# Patient Record
Sex: Female | Born: 1995
Health system: Southern US, Community
[De-identification: ages and names within clinical notes are randomized; demographics above are authoritative.]

## PROBLEM LIST (undated history)

## (undated) DIAGNOSIS — F17200 Nicotine dependence, unspecified, uncomplicated: Secondary | ICD-10-CM

## (undated) DIAGNOSIS — I8290 Acute embolism and thrombosis of unspecified vein: Secondary | ICD-10-CM

## (undated) DIAGNOSIS — T7840XA Allergy, unspecified, initial encounter: Secondary | ICD-10-CM

## (undated) DIAGNOSIS — D682 Hereditary deficiency of other clotting factors: Secondary | ICD-10-CM

## (undated) DIAGNOSIS — D6851 Activated protein C resistance: Secondary | ICD-10-CM

## (undated) DIAGNOSIS — G43909 Migraine, unspecified, not intractable, without status migrainosus: Secondary | ICD-10-CM

## (undated) DIAGNOSIS — J45909 Unspecified asthma, uncomplicated: Secondary | ICD-10-CM

## (undated) HISTORY — DX: Acute embolism and thrombosis of unspecified vein: I82.90

## (undated) HISTORY — DX: Unspecified asthma, uncomplicated: J45.909

## (undated) HISTORY — DX: Activated protein C resistance: D68.51

## (undated) HISTORY — DX: Allergy, unspecified, initial encounter: T78.40XA

## (undated) HISTORY — DX: Nicotine dependence, unspecified, uncomplicated: F17.200

## (undated) HISTORY — PX: TONSILLECTOMY: SUR1361

---
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
## 2015-01-01 ENCOUNTER — Ambulatory Visit (INDEPENDENT_AMBULATORY_CARE_PROVIDER_SITE_OTHER): Payer: BLUE CROSS/BLUE SHIELD

## 2015-01-01 DIAGNOSIS — Z23 Encounter for immunization: Secondary | ICD-10-CM | POA: Diagnosis not present

## 2015-01-04 ENCOUNTER — Encounter (HOSPITAL_COMMUNITY): Payer: BLUE CROSS/BLUE SHIELD

## 2015-01-04 ENCOUNTER — Ambulatory Visit: Payer: BLUE CROSS/BLUE SHIELD | Admitting: Vascular Surgery

## 2015-01-13 ENCOUNTER — Telehealth: Payer: Self-pay | Admitting: Vascular Surgery

## 2015-01-13 NOTE — Telephone Encounter (Signed)
LM for pt with appt info in response to Liz's stf msg (copied below)   Courtney Bush needs to have a 3 month fu scheduled. She was going to call to schedule this but she hasn't done it yet. It's a fu from stab phleb procedure so could be 6-8 weeks or 3 months. Whatever her sched allows. [phone]

## 2015-04-02 ENCOUNTER — Encounter: Payer: Self-pay | Admitting: Vascular Surgery

## 2015-04-05 ENCOUNTER — Encounter: Payer: Self-pay | Admitting: Vascular Surgery

## 2015-04-13 ENCOUNTER — Ambulatory Visit: Payer: BLUE CROSS/BLUE SHIELD | Admitting: Vascular Surgery

## 2015-07-28 ENCOUNTER — Encounter: Payer: Self-pay | Admitting: Physician Assistant

## 2015-07-28 ENCOUNTER — Ambulatory Visit (INDEPENDENT_AMBULATORY_CARE_PROVIDER_SITE_OTHER): Payer: BLUE CROSS/BLUE SHIELD | Admitting: Physician Assistant

## 2015-07-28 VITALS — BP 127/81 | HR 80 | Temp 97.9°F | Ht 67.02 in | Wt 138.6 lb

## 2015-07-28 DIAGNOSIS — M79672 Pain in left foot: Secondary | ICD-10-CM | POA: Diagnosis not present

## 2015-07-28 NOTE — Progress Notes (Signed)
Subjective:     Patient ID: Courtney Bush, female   DOB: Sep 29, 1995, 20 y.o.   MRN: [id number]  HPI Pt with knot to the L ankle x 1 month She denies any trauma/injury No hx of sprain/strains to the ankle No OTC meds for sx Swelling/sx worse by the end of the day  Review of Systems + pain but no numbness to the foot    Objective:   Physical Exam NAD Gait nl FROM of the ankle No ecchy to the ankle Sl full noted to the anterior lateral part of the ankle Good pulses/sensory Good strength    Assessment:     1. Left foot pain        Plan:     Discussed possibility of gang cyst  Nl course reviewed OTC NSAIDS If sx continue discussed referral to Ortho F/U prn

## 2015-07-28 NOTE — Patient Instructions (Signed)
Ganglion Cyst  A ganglion cyst is a noncancerous, fluid-filled lump that occurs near joints or tendons. The ganglion cyst grows out of a joint or the lining of a tendon. It most often develops in the hand or wrist, but it can also develop in the shoulder, elbow, hip, knee, ankle, or foot. The round or oval ganglion cyst can be the size of a pea or larger than a grape. Increased activity may enlarge the size of the cyst because more fluid starts to build up.   CAUSES  It is not known what causes a ganglion cyst to grow. However, it may be related to:  · Inflammation or irritation around the joint.  · An injury.  · Repetitive movements or overuse.  · Arthritis.  RISK FACTORS  Risk factors include:  · Being a woman.  · Being age 20-50.  SIGNS AND SYMPTOMS  Symptoms may include:   · A lump. This most often appears on the hand or wrist, but it can occur in other areas of the body.  · Tingling.  · Pain.  · Numbness.  · Muscle weakness.  · Weak grip.  · Less movement in a joint.  DIAGNOSIS  Ganglion cysts are most often diagnosed based on a physical exam. Your health care provider will feel the lump and may shine a light alongside it. If it is a ganglion cyst, a light often shines through it. Your health care provider may order an X-ray, ultrasound, or MRI to rule out other conditions.  TREATMENT  Ganglion cysts usually go away on their own without treatment. If pain or other symptoms are involved, treatment may be needed. Treatment is also needed if the ganglion cyst limits your movement or if it gets infected. Treatment may include:  · Wearing a brace or splint on your wrist or finger.  · Taking anti-inflammatory medicine.  · Draining fluid from the lump with a needle (aspiration).  · Injecting a steroid into the joint.  · Surgery to remove the ganglion cyst.  HOME CARE INSTRUCTIONS  · Do not press on the ganglion cyst, poke it with a needle, or hit it.  · Take medicines only as directed by your health care  provider.  · Wear your brace or splint as directed by your health care provider.  · Watch your ganglion cyst for any changes.  · Keep all follow-up visits as directed by your health care provider. This is important.  SEEK MEDICAL CARE IF:  · Your ganglion cyst becomes larger or more painful.  · You have increased redness, red streaks, or swelling.  · You have pus coming from the lump.  · You have weakness or numbness in the affected area.  · You have a fever or chills.     This information is not intended to replace advice given to you by your health care provider. Make sure you discuss any questions you have with your health care provider.     Document Released: 02/25/2000 Document Revised: 03/20/2014 Document Reviewed: 08/12/2013  Elsevier Interactive Patient Education ©2016 [employer]

## 2015-09-06 ENCOUNTER — Ambulatory Visit (INDEPENDENT_AMBULATORY_CARE_PROVIDER_SITE_OTHER): Payer: BLUE CROSS/BLUE SHIELD | Admitting: Vascular Surgery

## 2015-09-06 ENCOUNTER — Encounter: Payer: Self-pay | Admitting: Vascular Surgery

## 2015-09-06 VITALS — BP 133/87 | HR 81 | Temp 98.0°F | Resp 16 | Ht 67.0 in | Wt 135.0 lb

## 2015-09-06 DIAGNOSIS — I83891 Varicose veins of right lower extremities with other complications: Secondary | ICD-10-CM | POA: Diagnosis not present

## 2015-09-06 NOTE — Progress Notes (Signed)
Subjective:     Patient ID: Courtney Bush, female   DOB: 02-Jul-1995, 20 y.o.   MRN: [id number]  HPI this 20 year old female previously was scheduled for laser ablation of the right small saphenous vein plus multiple stab phlebectomy of painful varicosities in October 2016. The time of the procedure she was found to have the small saphenous vein thrombosed and therefore only underwent stab phlebectomy of multiple varicosities. She did not return for follow-up. She returns today complaining of some aching discomfort in the right calf but no specific recurrent varicosities, swelling, stasis ulcers, or bleeding. She does not were elastic compression stockings. She also has noticed a small "lump" on the dorsum of her left foot which she is concerned about. She has no history of DVT or thrombophlebitis.  Past Medical History  Diagnosis Date  . Asthma   . Allergy     factor fived deficiency    Social History  Substance Use Topics  . Smoking status: Never Smoker   . Smokeless tobacco: Never Used  . Alcohol Use: No    Family History  Problem Relation Age of Onset  . Hypertension Father   . Hyperlipidemia Father     Allergies  Allergen Reactions  . Other Hives and Rash    Tree nuts     Current outpatient prescriptions:  .  albuterol (PROVENTIL HFA;VENTOLIN HFA) 108 (90 BASE) MCG/ACT inhaler, Inhale 2 puffs into the lungs every 6 (six) hours as needed for wheezing., Disp: 1 Inhaler, Rfl: 2 .  EPINEPHrine (EPIPEN) 0.3 mg/0.3 mL SOAJ, Inject 0.3 mLs (0.3 mg total) into the muscle once. (Patient not taking: Reported on 07/28/2015), Disp: 1 Device, Rfl: 1 .  rizatriptan (MAXALT) 10 MG tablet, Take 1 tablet (10 mg total) by mouth as needed for migraine. May repeat in 2 hours if needed, Disp: 10 tablet, Rfl: 0  Filed Vitals:   09/06/15 1331  BP: 133/87  Pulse: 81  Temp: 98 F (36.7 C)  Resp: 16  Height: 5\' 7"  (1.702 m)  Weight: 135 lb (61.236 kg)  SpO2: 100%    Body mass index is 21.14  kg/(m^2).           Review of Systems denies chest pain, dyspnea on exertion, PND, orthopnea, hemoptysis     Objective:   Physical Exam BP 133/87 mmHg  Pulse 81  Temp(Src) 98 F (36.7 C)  Resp 16  Ht 5\' 7"  (1.702 m)  Wt 135 lb (61.236 kg)  BMI 21.14 kg/m2  SpO2 100%  Gen. healthy female no apparent distress alert and oriented 3 Lungs no rhonchi or wheezing Cardiovascular regular rhythm no murmurs Right leg with no varicosities noted. No distal edema noted. 3+ dorsalis pedis pulse palpable. Left leg with small 4 mm nodule dorsum left foot which appears to be a small ganglion cyst. Does not appear to be associated with arterial or venous systems. 3+ dorsalis pedis pulse palpable. No varicosities noted left leg. No edema noted.  I performed an independent sono site ultrasound exam on the right leg posteriorly in the right small saphenous vein is very small in caliber with no varicosities noted     Assessment:     Status post multiple stab phlebectomy painful varicosities right leg performed in October 2016-doing well Probable small ganglion cyst dorsum left foot    Plan:     If nodule on dorsum of left foot becomes tender or enlarges she should be evaluated by orthopedic surgeon Return to see me on when  necessary basis-no indication for any venous procedure

## 2015-09-23 DIAGNOSIS — M67472 Ganglion, left ankle and foot: Secondary | ICD-10-CM | POA: Diagnosis not present

## 2015-09-23 DIAGNOSIS — M779 Enthesopathy, unspecified: Secondary | ICD-10-CM | POA: Diagnosis not present

## 2015-10-15 ENCOUNTER — Other Ambulatory Visit: Payer: Self-pay | Admitting: Nurse Practitioner

## 2015-10-15 ENCOUNTER — Telehealth: Payer: Self-pay | Admitting: *Deleted

## 2015-10-15 ENCOUNTER — Ambulatory Visit (INDEPENDENT_AMBULATORY_CARE_PROVIDER_SITE_OTHER): Payer: BLUE CROSS/BLUE SHIELD | Admitting: Nurse Practitioner

## 2015-10-15 ENCOUNTER — Ambulatory Visit (HOSPITAL_COMMUNITY)
Admission: RE | Admit: 2015-10-15 | Discharge: 2015-10-15 | Disposition: A | Discharge status: Home / Self Care | Payer: BLUE CROSS/BLUE SHIELD | Source: Ambulatory Visit | Attending: Nurse Practitioner | Admitting: Nurse Practitioner

## 2015-10-15 ENCOUNTER — Encounter: Payer: Self-pay | Admitting: Nurse Practitioner

## 2015-10-15 VITALS — BP 113/72 | HR 73 | Temp 97.7°F

## 2015-10-15 DIAGNOSIS — I83811 Varicose veins of right lower extremities with pain: Secondary | ICD-10-CM

## 2015-10-15 DIAGNOSIS — M79604 Pain in right leg: Secondary | ICD-10-CM

## 2015-10-15 DIAGNOSIS — M79661 Pain in right lower leg: Secondary | ICD-10-CM | POA: Diagnosis not present

## 2015-10-15 NOTE — Telephone Encounter (Signed)
Spoke with Nicholaus Bloom Ladona Ridgel White's mother).  Nicholaus Bloom states that her daughter has been experiencing  pain in posterior right calf for 3 days.  Nicholaus Bloom says the pain is radiating up towards her daughter's back.  Nicholaus Bloom states her daughter denies right foot or ankle swelling and denies trauma to right leg in recent past.  Kandas Kravets is s/p stab phlebectomy right leg 12-2014 and was last seen by Dr. Hart Rochester on 09-06-2015.  Reviewed Dr. Candie Chroman office note with Mrs. White.  On ultrasound (Sonosite) exam by Dr. Hart Rochester on 09-06-2015, Dr. Hart Rochester said the right small saphenous vein was of small caliber and that no venous treatment/procedures were recommended.  Dr. Hart Rochester suggested that Jerline Pain be evaluated by orthopedic surgeon.  Advised Mrs. White to call [facility] if she had further questions or concerns.

## 2015-10-15 NOTE — Progress Notes (Signed)
   Subjective:    Patient ID: Courtney Bush, female    DOB: [birth year], 20 y.o.   MRN: [id number]  HPI Patient comes in today with her mom c/o right leg pain - mainly in calf - pain starts in right calf and travels up the back of her leg and into butt cheek. Painful to walk on- elevating leg and laying still makes pain better. Has tried ibuprofen as well which has helped some. SHe has had a veins removed from that leg last year.    Review of Systems  Constitutional: Negative.   HENT: Negative.   Respiratory: Negative.   Cardiovascular: Negative.   Genitourinary: Negative.   Neurological: Negative.   Psychiatric/Behavioral: Negative.   All other systems reviewed and are negative.      Objective:   Physical Exam  Constitutional: She is oriented to person, place, and time. She appears well-developed and well-nourished.  Cardiovascular: Normal rate, regular rhythm and normal heart sounds.   Pulmonary/Chest: Effort normal.  Musculoskeletal:  Varicose vein right posterior knee that is painful on palpation.  Neurological: She is alert and oriented to person, place, and time.  Skin: Skin is warm.  Psychiatric: She has a normal mood and affect. Her behavior is normal. Judgment and thought content normal.    BP 113/72   Pulse 73   Temp 97.7 F (36.5 C) (Oral)       Assessment & Plan:   1. Right leg pain   2. Varicose veins of leg with pain, right    U/s ordered Will call with results RTO prn  Mary-Margaret Daphine Deutscher, FNP

## 2015-10-16 ENCOUNTER — Encounter: Payer: Self-pay | Admitting: Nurse Practitioner

## 2015-10-16 ENCOUNTER — Telehealth: Payer: Self-pay | Admitting: Pediatrics

## 2015-10-16 NOTE — Telephone Encounter (Signed)
Called and done by [facility]

## 2016-01-04 ENCOUNTER — Ambulatory Visit (INDEPENDENT_AMBULATORY_CARE_PROVIDER_SITE_OTHER): Payer: BLUE CROSS/BLUE SHIELD

## 2016-01-04 DIAGNOSIS — Z23 Encounter for immunization: Secondary | ICD-10-CM

## 2016-01-07 ENCOUNTER — Encounter: Payer: Self-pay | Admitting: Physician Assistant

## 2016-01-07 ENCOUNTER — Ambulatory Visit (INDEPENDENT_AMBULATORY_CARE_PROVIDER_SITE_OTHER): Payer: BLUE CROSS/BLUE SHIELD | Admitting: Physician Assistant

## 2016-01-07 VITALS — BP 113/77 | HR 94 | Temp 97.2°F | Ht 67.0 in | Wt 130.0 lb

## 2016-01-07 DIAGNOSIS — T63301A Toxic effect of unspecified spider venom, accidental (unintentional), initial encounter: Secondary | ICD-10-CM | POA: Diagnosis not present

## 2016-01-07 MED ORDER — DIPHENHYDRAMINE HCL 25 MG PO CAPS
50.0000 mg | ORAL_CAPSULE | Freq: Once | ORAL | Status: AC
Start: 2016-01-07 — End: 2016-01-07
  Administered 2016-01-07: 50 mg via ORAL

## 2016-01-07 MED ORDER — CEPHALEXIN 500 MG PO CAPS: 500.0000 mg | ORAL_CAPSULE | Freq: Three times a day (TID) | ORAL | 0 refills | Status: DC

## 2016-01-07 MED ORDER — METHYLPREDNISOLONE ACETATE 80 MG/ML IJ SUSP
80.0000 mg | Freq: Once | INTRAMUSCULAR | Status: AC
  Administered 2016-01-07: 80 mg via INTRAMUSCULAR

## 2016-01-07 NOTE — Patient Instructions (Signed)
Black Widow Spider Bite °The adult female black widow spider has a black body about ½ inch (1.2 cm) long with an orange-red hourglass shape on her belly. Black widow spider bites can be serious and life-threatening. °SYMPTOMS  °Symptoms usually develop in 15 minutes to 2 hours after the bite. Symptoms usually peak in 3 hours to 1 day and may include: °· Muscle cramps. These may occur anywhere in the body, including the abdomen. °· Nausea and vomiting. °· Dizziness. °· Heavy sweating. °· Shaking. °· Restlessness. °· Fever. °· Trouble breathing. °· Chest pain. °· Swelling or a rash around the bite. °TREATMENT  °Serious reactions may require hospitalization. Your caregiver may give you an antivenom injection to help reduce pain. This is only done for very severe reactions that are not effectively treated with other medicines. There is a chance of having an allergic reaction to the antivenom since it is derived from horse serum. °HOME CARE INSTRUCTIONS  °· Do not scratch the bite area. Keep the area clean and covered with an adhesive bandage or sterile gauze bandage. °· Wash the area 3 times per day in warm, soapy water or as directed by your caregiver. °· Put ice or cool compresses on the bite area. °¨ Put ice in a plastic bag. °¨ Place a towel between your skin and the bag. °¨ Leave the ice on for 20 minutes, 4 times a day for the first 2 days or as directed. °· Keep the bite area elevated above the level of your heart. This helps reduce redness and swelling. °· Only take over-the-counter or prescription medicines for pain, discomfort, or fever as directed by your caregiver. °· Watch the bite area closely for worsening pain, swelling, redness, or pus. °You may need a tetanus shot if: °· You cannot remember when you had your last tetanus shot. °· You have never had a tetanus shot. °· The injury broke your skin. °If you get a tetanus shot, your arm may swell, get red, and feel warm to the touch. This is common and not a  problem. If you need a tetanus shot and you choose not to have one, there is a rare chance of getting tetanus. Sickness from tetanus can be serious. °SEEK IMMEDIATE MEDICAL CARE IF:  °· You have muscle cramps or abdominal pain or cramps. °· You develop rapid breathing or a rapid heartbeat. °· You become extremely restless or confused. °· You have chest pain. °· You feel faint, lightheaded, or you feel generally ill (malaise). °· You develop pain, soreness, redness, swelling, or drainage from the bite. °· You have a fever. °MAKE SURE YOU:  °· Understand these instructions. °· Will watch your condition. °· Will get help right away if you are not doing well or get worse. °  °This information is not intended to replace advice given to you by your health care provider. Make sure you discuss any questions you have with your health care provider. °  °Document Released: 02/27/2005 Document Revised: 05/22/2011 Document Reviewed: 07/15/2014 °Elsevier Interactive Patient Education ©2016 Elsevier Inc. ° °

## 2016-01-07 NOTE — Progress Notes (Signed)
BP 113/77 (BP Location: Left Arm, Patient Position: Sitting, Cuff Size: Normal)   Pulse 94   Temp 97.2 F (36.2 C) (Oral)   Ht 5\' 7"  (1.702 m)   Wt 130 lb (59 kg)   SpO2 100%   BMI 20.36 kg/m    Subjective:    Patient ID: Courtney Bush, female    DOB: 1995-11-12, 20 y.o.   MRN: [id number]  HPI: Courtney Bush is a 20 y.o. female presenting on 01/07/2016 for Insect Bite Was bitten just a few hours ago. Was a black d=spider that cam out of her bath towel. Bit her on the right upper arm. Now with pain and swelling at the site, some throat fullness and awareness when she swallows. Denies SOB, cough or wheeze.  No NVD.  Spider was not a black widow.   Relevant past medical, surgical, family and social history reviewed and updated as indicated. Allergies and medications reviewed and updated.  Past Medical History:  Diagnosis Date  . Allergy    factor fived deficiency  . Asthma     Past Surgical History:  Procedure Laterality Date  . TONSILLECTOMY      Review of Systems  Constitutional: Negative.  Negative for activity change, fatigue and fever.  HENT: Negative.   Eyes: Negative.   Respiratory: Positive for apnea, chest tightness and shortness of breath. Negative for cough, choking, wheezing and stridor.   Cardiovascular: Negative.  Negative for chest pain.  Gastrointestinal: Negative.  Negative for abdominal pain.  Endocrine: Negative.   Genitourinary: Negative.  Negative for dysuria.  Musculoskeletal: Negative.   Skin: Negative.   Neurological: Negative.       Medication List       Accurate as of 01/07/16 12:12 PM. Always use your most recent med list.          albuterol 108 (90 Base) MCG/ACT inhaler Commonly known as:  PROVENTIL HFA;VENTOLIN HFA Inhale 2 puffs into the lungs every 6 (six) hours as needed for wheezing.   cephALEXin 500 MG capsule Commonly known as:  KEFLEX Take 1 capsule (500 mg total) by mouth 3 (three) times daily.   EPINEPHrine 0.3  mg/0.3 mL Soaj injection Commonly known as:  EPIPEN Inject 0.3 mLs (0.3 mg total) into the muscle once.   rizatriptan 10 MG tablet Commonly known as:  MAXALT Take 1 tablet (10 mg total) by mouth as needed for migraine. May repeat in 2 hours if needed          Objective:    BP 113/77 (BP Location: Left Arm, Patient Position: Sitting, Cuff Size: Normal)   Pulse 94   Temp 97.2 F (36.2 C) (Oral)   Ht 5\' 7"  (1.702 m)   Wt 130 lb (59 kg)   SpO2 100%   BMI 20.36 kg/m   Allergies  Allergen Reactions  . Other Hives and Rash    Tree nuts    Physical Exam  Constitutional: She is oriented to person, place, and time. She appears well-developed and well-nourished.  HENT:  Head: Normocephalic and atraumatic.  Eyes: Conjunctivae and EOM are normal. Pupils are equal, round, and reactive to light.  Neck: Normal range of motion. Neck supple.  Cardiovascular: Normal rate, regular rhythm, normal heart sounds and intact distal pulses.   Pulmonary/Chest: Effort normal and breath sounds normal.  Abdominal: Soft. Bowel sounds are normal.  Neurological: She is alert and oriented to person, place, and time. She has normal reflexes.  Skin: Skin is warm and  dry. Lesion and rash noted. Rash is urticarial. No cyanosis. No pallor.     Psychiatric: She has a normal mood and affect. Her behavior is normal. Judgment and thought content normal.  Nursing note and vitals reviewed.       Assessment & Plan:   1. Allergic reaction to spider bite, accidental or unintentional, initial encounter - cephALEXin (KEFLEX) 500 MG capsule; Take 1 capsule (500 mg total) by mouth 3 (three) times daily.  Dispense: 21 capsule; Refill: 0 Benadryl 50 mg orally in office Depomedrol 80 mg IM in office Out of work today.  Continue all other maintenance medications as listed above.  Follow up plan: Return if symptoms worsen or fail to improve.  Educational handout given for spider bite  Remus LofflerAngel S. Armanii Pressnell  PA-C [facility] [address]  [place], [place] [id number] [phone]   01/07/2016, 12:12 PM

## 2016-05-20 ENCOUNTER — Ambulatory Visit (INDEPENDENT_AMBULATORY_CARE_PROVIDER_SITE_OTHER): Payer: BLUE CROSS/BLUE SHIELD | Admitting: Physician Assistant

## 2016-05-20 VITALS — BP 119/68 | HR 88 | Temp 98.1°F | Ht 67.0 in | Wt 127.0 lb

## 2016-05-20 DIAGNOSIS — G43909 Migraine, unspecified, not intractable, without status migrainosus: Secondary | ICD-10-CM | POA: Diagnosis not present

## 2016-05-20 DIAGNOSIS — M545 Low back pain, unspecified: Secondary | ICD-10-CM

## 2016-05-20 DIAGNOSIS — M546 Pain in thoracic spine: Secondary | ICD-10-CM | POA: Diagnosis not present

## 2016-05-20 DIAGNOSIS — R200 Anesthesia of skin: Secondary | ICD-10-CM | POA: Diagnosis not present

## 2016-05-20 DIAGNOSIS — R599 Enlarged lymph nodes, unspecified: Secondary | ICD-10-CM | POA: Diagnosis not present

## 2016-05-20 DIAGNOSIS — R51 Headache: Secondary | ICD-10-CM | POA: Diagnosis not present

## 2016-05-20 DIAGNOSIS — R519 Headache, unspecified: Secondary | ICD-10-CM

## 2016-05-20 DIAGNOSIS — M542 Cervicalgia: Secondary | ICD-10-CM

## 2016-05-20 MED ORDER — PREDNISONE 10 MG (21) PO TBPK: ORAL_TABLET | ORAL | 0 refills | Status: DC

## 2016-05-20 MED ORDER — RIZATRIPTAN BENZOATE 10 MG PO TABS: 10.0000 mg | ORAL_TABLET | ORAL | 0 refills | Status: DC | PRN

## 2016-05-20 NOTE — Patient Instructions (Signed)
Back Pain, Adult  Back pain is very common. The pain often gets better over time. The cause of back pain is usually not dangerous. Most people can learn to manage their back pain on their own.  Follow these instructions at home:  Watch your back pain for any changes. The following actions may help to lessen any pain you are feeling:  · Stay active. Start with short walks on flat ground if you can. Try to walk farther each day.  · Exercise regularly as told by your doctor. Exercise helps your back heal faster. It also helps avoid future injury by keeping your muscles strong and flexible.  · Do not sit, drive, or stand in one place for more than 30 minutes.  · Do not stay in bed. Resting more than 1-2 days can slow down your recovery.  · Be careful when you bend or lift an object. Use good form when lifting:  ? Bend at your knees.  ? Keep the object close to your body.  ? Do not twist.  · Sleep on a firm mattress. Lie on your side, and bend your knees. If you lie on your back, put a pillow under your knees.  · Take medicines only as told by your doctor.  · Put ice on the injured area.  ? Put ice in a plastic bag.  ? Place a towel between your skin and the bag.  ? Leave the ice on for 20 minutes, 2-3 times a day for the first 2-3 days. After that, you can switch between ice and heat packs.  · Avoid feeling anxious or stressed. Find good ways to deal with stress, such as exercise.  · Maintain a healthy weight. Extra weight puts stress on your back.    Contact a doctor if:  · You have pain that does not go away with rest or medicine.  · You have worsening pain that goes down into your legs or buttocks.  · You have pain that does not get better in one week.  · You have pain at night.  · You lose weight.  · You have a fever or chills.  Get help right away if:  · You cannot control when you poop (bowel movement) or pee (urinate).  · Your arms or legs feel weak.  · Your arms or legs lose feeling (numbness).  · You feel sick  to your stomach (nauseous) or throw up (vomit).  · You have belly (abdominal) pain.  · You feel like you may pass out (faint).  This information is not intended to replace advice given to you by your health care provider. Make sure you discuss any questions you have with your health care provider.  Document Released: 08/16/2007 Document Revised: 08/05/2015 Document Reviewed: 07/01/2013  Elsevier Interactive Patient Education © 2017 Elsevier Inc.

## 2016-05-22 LAB — URINALYSIS
Bilirubin, UA: NEGATIVE
Glucose, UA: NEGATIVE
Ketones, UA: NEGATIVE
Nitrite, UA: NEGATIVE
Protein, UA: NEGATIVE
RBC, UA: NEGATIVE
Specific Gravity, UA: 1.02 (ref 1.005–1.030)
Urobilinogen, Ur: 0.2 mg/dL (ref 0.2–1.0)
pH, UA: 5.5 (ref 5.0–7.5)

## 2016-05-22 NOTE — Progress Notes (Signed)
BP 119/68   Pulse 88   Temp 98.1 F (36.7 C) (Oral)   Ht 5' 7"  (1.702 m)   Wt 127 lb (57.6 kg)   BMI 19.89 kg/m    Subjective:    Patient ID: Courtney Bush, female    DOB: 25-Feb-1996, 21 y.o.   MRN: [id number]  HPI: Courtney Bush is a 21 y.o. female presenting on 05/20/2016 for Neck Pain; Back Pain; and enlarged lymph node in neck  Over 5 days increasing headache and associated midline back back from the cervical to the lumbar area. Has episodic associated bilateral hand numbness. 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 Does not remember any head injury distant , no ticks have been seen. Family history is negative for neurologic disorders. Relevant past medical, surgical, family and social history reviewed and updated as indicated. Allergies and medications reviewed and updated.  Past Medical History:  Diagnosis Date  . Allergy    factor fived deficiency  . Asthma     Past Surgical History:  Procedure Laterality Date  . TONSILLECTOMY      Review of Systems  Constitutional: Negative for activity change, fatigue and fever.  HENT: Negative.   Eyes: Negative.   Respiratory: Negative.  Negative for cough.   Cardiovascular: Negative.  Negative for chest pain.  Gastrointestinal: Negative.  Negative for abdominal pain.  Endocrine: Negative.   Genitourinary: Negative.  Negative for dysuria.  Musculoskeletal: Positive for arthralgias, back pain, myalgias, neck pain and neck stiffness.  Skin: Negative.   Neurological: Positive for weakness, numbness and headaches. Negative for dizziness, tremors, seizures, syncope, facial asymmetry and light-headedness.    Allergies as of 05/20/2016      Reactions   Other Hives, Rash   Tree nuts      Medication List       Accurate as of 05/20/16 11:59 PM. Always use your most recent med list.          albuterol 108 (90 Base) MCG/ACT inhaler Commonly known as:  PROVENTIL HFA;VENTOLIN HFA Inhale 2 puffs into  the lungs every 6 (six) hours as needed for wheezing.   EPINEPHrine 0.3 mg/0.3 mL Soaj injection Commonly known as:  EPIPEN Inject 0.3 mLs (0.3 mg total) into the muscle once.   predniSONE 10 MG (21) Tbpk tablet Commonly known as:  STERAPRED UNI-PAK 21 TAB As directed x 6 days   rizatriptan 10 MG tablet Commonly known as:  MAXALT Take 1 tablet (10 mg total) by mouth as needed for migraine. May repeat in 2 hours if needed          Objective:    BP 119/68   Pulse 88   Temp 98.1 F (36.7 C) (Oral)   Ht 5' 7"  (1.702 m)   Wt 127 lb (57.6 kg)   BMI 19.89 kg/m   Allergies  Allergen Reactions  . Other Hives and Rash    Tree nuts    Physical Exam  Constitutional: She is oriented to person, place, and time. She appears well-developed and well-nourished.  HENT:  Head: Normocephalic and atraumatic.  Right Ear: Tympanic membrane, external ear and ear canal normal.  Left Ear: Tympanic membrane, external ear and ear canal normal.  Nose: Nose normal. No rhinorrhea.  Mouth/Throat: Oropharynx is clear and moist and mucous membranes are normal. No oropharyngeal exudate or posterior oropharyngeal erythema.  Eyes: Conjunctivae and EOM are normal. Pupils are equal, round, and reactive to light.  Neck: Normal range of  motion. Neck supple.  Cardiovascular: Normal rate, regular rhythm, normal heart sounds and intact distal pulses.   Pulmonary/Chest: Effort normal and breath sounds normal.  Abdominal: Soft. Bowel sounds are normal.  Musculoskeletal:       Cervical back: She exhibits tenderness and spasm. She exhibits normal range of motion.       Thoracic back: She exhibits tenderness and pain. She exhibits normal range of motion and no deformity.       Lumbar back: She exhibits tenderness and pain. She exhibits normal range of motion and no deformity.  Neurological: She is alert and oriented to person, place, and time. She has normal reflexes.  Skin: Skin is warm and dry. No rash noted.    Psychiatric: She has a normal mood and affect. Her behavior is normal. Judgment and thought content normal.  Nursing note and vitals reviewed.   Results for orders placed or performed in visit on 05/20/16  Urinalysis  Result Value Ref Range   Specific Gravity, UA 1.020 1.005 - 1.030   pH, UA 5.5 5.0 - 7.5   Color, UA Yellow Yellow   Appearance Ur Clear Clear   Leukocytes, UA Trace (A) Negative   Protein, UA Negative Negative/Trace   Glucose, UA Negative Negative   Ketones, UA Negative Negative   RBC, UA Negative Negative   Bilirubin, UA Negative Negative   Urobilinogen, Ur 0.2 0.2 - 1.0 mg/dL   Nitrite, UA Negative Negative  CBC with Differential  Result Value Ref Range   WBC 7.0 3.4 - 10.8 x10E3/uL   RBC 4.47 3.77 - 5.28 x10E6/uL   Hemoglobin 14.3 11.1 - 15.9 g/dL   Hematocrit 42.5 34.0 - 46.6 %   MCV 95 79 - 97 fL   MCH 32.0 26.6 - 33.0 pg   MCHC 33.6 31.5 - 35.7 g/dL   RDW 12.9 12.3 - 15.4 %   Platelets 289 150 - 379 x10E3/uL   Neutrophils 46 Not Estab. %   Lymphs 39 Not Estab. %   Monocytes 11 Not Estab. %   Eos 4 Not Estab. %   Basos 0 Not Estab. %   Neutrophils Absolute 3.2 1.4 - 7.0 x10E3/uL   Lymphocytes Absolute 2.7 0.7 - 3.1 x10E3/uL   Monocytes Absolute 0.8 0.1 - 0.9 x10E3/uL   EOS (ABSOLUTE) 0.3 0.0 - 0.4 x10E3/uL   Basophils Absolute 0.0 0.0 - 0.2 x10E3/uL   Immature Granulocytes 0 Not Estab. %   Immature Grans (Abs) 0.0 0.0 - 0.1 x10E3/uL  CMP14+EGFR  Result Value Ref Range   Glucose 70 65 - 99 mg/dL   BUN 7 6 - 20 mg/dL   Creatinine, Ser 0.87 0.57 - 1.00 mg/dL   GFR calc non Af Amer 96 >59 mL/min/1.73   GFR calc Af Amer 111 >59 mL/min/1.73   BUN/Creatinine Ratio 8 (L) 9 - 23   Sodium 144 134 - 144 mmol/L   Potassium 4.6 3.5 - 5.2 mmol/L   Chloride 105 96 - 106 mmol/L   CO2 25 18 - 29 mmol/L   Calcium 9.5 8.7 - 10.2 mg/dL   Total Protein 6.8 6.0 - 8.5 g/dL   Albumin 4.4 3.5 - 5.5 g/dL   Globulin, Total 2.4 1.5 - 4.5 g/dL   Albumin/Globulin  Ratio 1.8 1.2 - 2.2   Bilirubin Total 0.4 0.0 - 1.2 mg/dL   Alkaline Phosphatase 42 39 - 117 IU/L   AST 16 0 - 40 IU/L   ALT 12 0 - 32 IU/L  Lyme Ab/Western  Blot Reflex  Result Value Ref Range   Lyme IgG/IgM Ab WILL FOLLOW    LYME DISEASE AB, QUANT, IGM WILL FOLLOW   Rocky mtn spotted fvr abs pnl(IgG+IgM)  Result Value Ref Range   RMSF IgG WILL FOLLOW    RMSF IgM WILL FOLLOW       Assessment & Plan:   1. Acute midline low back pain without sciatica - Urinalysis  2. Neck pain - Ambulatory referral to Neurology  3. Acute midline thoracic back pain - CBC with Differential - CMP14+EGFR - Ambulatory referral to Neurology  4. Enlarged lymph node - Lyme Ab/Western Blot Reflex - Rocky mtn spotted fvr abs pnl(IgG+IgM)  5. Acute nonintractable headache, unspecified headache type - CBC with Differential - CMP14+EGFR - Ambulatory referral to Neurology - Lyme Ab/Western Blot Reflex - Rocky mtn spotted fvr abs pnl(IgG+IgM)  6. Hand numbness - Ambulatory referral to Neurology - Lyme Ab/Western Blot Reflex - Rocky mtn spotted fvr abs pnl(IgG+IgM)  7. Migraine without status migrainosus, not intractable, unspecified migraine type - rizatriptan (MAXALT) 10 MG tablet; Take 1 tablet (10 mg total) by mouth as needed for migraine. May repeat in 2 hours if needed  Dispense: 10 tablet; Refill: 0   Continue all other maintenance medications as listed above.  Follow up plan: Return if symptoms worsen or fail to improve.  Educational handout given for headache  Terald Sleeper PA-C [facility] [address]  [place], [place] [address] [phone]   05/22/2016, 10:56 AM

## 2016-05-24 LAB — LYME, WESTERN BLOT, SERUM (REFLEXED)
IgG P18 Ab.: ABSENT
IgG P23 Ab.: ABSENT
IgG P30 Ab.: ABSENT
IgG P39 Ab.: ABSENT
IgG P45 Ab.: ABSENT
IgG P58 Ab.: ABSENT
IgG P93 Ab.: ABSENT
IgM P23 Ab.: ABSENT
IgM P39 Ab.: ABSENT
IgM P41 Ab.: ABSENT
Lyme IgG Wb: NEGATIVE
Lyme IgM Wb: NEGATIVE

## 2016-05-24 LAB — ROCKY MTN SPOTTED FVR ABS PNL(IGG+IGM)
RMSF IgG: NEGATIVE
RMSF IgM: 0.68 index (ref 0.00–0.89)

## 2016-05-24 LAB — CBC WITH DIFFERENTIAL/PLATELET
Basophils Absolute: 0 10*3/uL (ref 0.0–0.2)
Basos: 0 %
EOS (ABSOLUTE): 0.3 10*3/uL (ref 0.0–0.4)
Eos: 4 %
Hematocrit: 42.5 % (ref 34.0–46.6)
Hemoglobin: 14.3 g/dL (ref 11.1–15.9)
Immature Grans (Abs): 0 10*3/uL (ref 0.0–0.1)
Immature Granulocytes: 0 %
Lymphocytes Absolute: 2.7 10*3/uL (ref 0.7–3.1)
Lymphs: 39 %
MCH: 32 pg (ref 26.6–33.0)
MCHC: 33.6 g/dL (ref 31.5–35.7)
MCV: 95 fL (ref 79–97)
Monocytes Absolute: 0.8 10*3/uL (ref 0.1–0.9)
Monocytes: 11 %
Neutrophils Absolute: 3.2 10*3/uL (ref 1.4–7.0)
Neutrophils: 46 %
Platelets: 289 10*3/uL (ref 150–379)
RBC: 4.47 x10E6/uL (ref 3.77–5.28)
RDW: 12.9 % (ref 12.3–15.4)
WBC: 7 10*3/uL (ref 3.4–10.8)

## 2016-05-24 LAB — CMP14+EGFR
ALT: 12 IU/L (ref 0–32)
AST: 16 IU/L (ref 0–40)
Albumin/Globulin Ratio: 1.8 (ref 1.2–2.2)
Albumin: 4.4 g/dL (ref 3.5–5.5)
Alkaline Phosphatase: 42 IU/L (ref 39–117)
BUN/Creatinine Ratio: 8 — ABNORMAL LOW (ref 9–23)
BUN: 7 mg/dL (ref 6–20)
Bilirubin Total: 0.4 mg/dL (ref 0.0–1.2)
CO2: 25 mmol/L (ref 18–29)
Calcium: 9.5 mg/dL (ref 8.7–10.2)
Chloride: 105 mmol/L (ref 96–106)
Creatinine, Ser: 0.87 mg/dL (ref 0.57–1.00)
GFR calc Af Amer: 111 mL/min/{1.73_m2} (ref >59)
GFR calc non Af Amer: 96 mL/min/{1.73_m2} (ref >59)
Globulin, Total: 2.4 g/dL (ref 1.5–4.5)
Glucose: 70 mg/dL (ref 65–99)
Potassium: 4.6 mmol/L (ref 3.5–5.2)
Sodium: 144 mmol/L (ref 134–144)
Total Protein: 6.8 g/dL (ref 6.0–8.5)

## 2016-05-24 LAB — LYME AB/WESTERN BLOT REFLEX
LYME DISEASE AB, QUANT, IGM: <0.8 index (ref 0.00–0.79)
Lyme IgG/IgM Ab: 0.97 {ISR} — ABNORMAL HIGH (ref 0.00–0.90)

## 2016-05-26 ENCOUNTER — Other Ambulatory Visit: Payer: Self-pay | Admitting: *Deleted

## 2016-05-26 MED ORDER — DOXYCYCLINE HYCLATE 100 MG PO TABS: 100.0000 mg | ORAL_TABLET | Freq: Two times a day (BID) | ORAL | 0 refills | Status: DC

## 2016-05-26 NOTE — Progress Notes (Signed)
doxy 

## 2016-05-30 ENCOUNTER — Ambulatory Visit (INDEPENDENT_AMBULATORY_CARE_PROVIDER_SITE_OTHER): Payer: BLUE CROSS/BLUE SHIELD | Admitting: Family Medicine

## 2016-05-30 ENCOUNTER — Ambulatory Visit (INDEPENDENT_AMBULATORY_CARE_PROVIDER_SITE_OTHER): Payer: BLUE CROSS/BLUE SHIELD

## 2016-05-30 ENCOUNTER — Encounter: Payer: Self-pay | Admitting: Family Medicine

## 2016-05-30 VITALS — BP 103/70 | HR 84 | Temp 97.0°F | Ht 67.0 in | Wt 127.6 lb

## 2016-05-30 DIAGNOSIS — M545 Low back pain, unspecified: Secondary | ICD-10-CM

## 2016-05-30 NOTE — Progress Notes (Signed)
   Subjective:    Patient ID: Courtney Leeaylor N Bush, female    DOB: 09-Sep-1995, 21 y.o.   MRN: [id number]  HPI patient has neck and back pain. Symptoms are vague. There is no radiculopathy. Pain is improved with sitting and bending but aggravated by standing. She was treated initially with prednisone and felt better but wants prednisone. Symptoms recurred. She also tested positive for Lyme disease and is currently taking doxycycline.  Patient Active Problem List   Diagnosis Date Noted  . Varicose veins of right lower extremity with complications 09/06/2015  . Varicose veins of right lower extremity 12/28/2014  . Migraine 12/03/2014  . Tree nut allergy 12/03/2014  . Allergic rhinitis 12/03/2014  . Varicose veins of leg with complications 10/27/2014   Outpatient Encounter Prescriptions as of 05/30/2016  Medication Sig  . albuterol (PROVENTIL HFA;VENTOLIN HFA) 108 (90 BASE) MCG/ACT inhaler Inhale 2 puffs into the lungs every 6 (six) hours as needed for wheezing.  Marland Kitchen. doxycycline (VIBRA-TABS) 100 MG tablet Take 1 tablet (100 mg total) by mouth 2 (two) times daily.  Marland Kitchen. EPINEPHrine (EPIPEN) 0.3 mg/0.3 mL SOAJ Inject 0.3 mLs (0.3 mg total) into the muscle once.  . rizatriptan (MAXALT) 10 MG tablet Take 1 tablet (10 mg total) by mouth as needed for migraine. May repeat in 2 hours if needed  . [DISCONTINUED] predniSONE (STERAPRED UNI-PAK 21 TAB) 10 MG (21) TBPK tablet As directed x 6 days   No facility-administered encounter medications on file as of 05/30/2016.       Review of Systems  Musculoskeletal: Positive for back pain and neck pain.       Objective:   Physical Exam  Constitutional: She is oriented to person, place, and time. She appears well-developed and well-nourished.  Musculoskeletal: Normal range of motion. She exhibits no tenderness.  Neurological: She is alert and oriented to person, place, and time. She displays normal reflexes. No cranial nerve deficit. She exhibits normal muscle tone.  Coordination normal.    Temp 97 F (36.1 C) (Oral)   Ht 5\' 7"  (1.702 m)   Wt 127 lb 9.6 oz (57.9 kg)   LMP 05/29/2016   BMI 19.98 kg/m        Assessment & Plan:  1. Acute midline low back pain without sciatica C-spine is some flattening of the disc space. Encouraged to keep appointment with neurologist. Encouraged to continue continue with full course of doxycycline for positive Lyme titer - DG Cervical Spine Complete; Future - DG Lumbar Spine 2-3 Views; Future  Frederica KusterStephen M Miller MD

## 2016-05-30 NOTE — Progress Notes (Signed)
   Subjective:    Patient ID: Courtney Bush, female    DOB: 1995/08/23, 21 y.o.   MRN: [id number]  HPI    Review of Systems     Objective:   Physical Exam        Assessment & Plan:

## 2016-06-13 ENCOUNTER — Ambulatory Visit: Payer: BLUE CROSS/BLUE SHIELD | Admitting: Neurology

## 2016-06-13 ENCOUNTER — Telehealth: Payer: Self-pay | Admitting: Neurology

## 2016-06-13 DIAGNOSIS — M545 Low back pain: Secondary | ICD-10-CM | POA: Diagnosis not present

## 2016-06-13 DIAGNOSIS — M41124 Adolescent idiopathic scoliosis, thoracic region: Secondary | ICD-10-CM | POA: Diagnosis not present

## 2016-06-13 DIAGNOSIS — M546 Pain in thoracic spine: Secondary | ICD-10-CM | POA: Diagnosis not present

## 2016-06-13 NOTE — Telephone Encounter (Signed)
This patient canceled same day of appointment. 

## 2016-06-14 ENCOUNTER — Encounter: Payer: Self-pay | Admitting: Neurology

## 2016-07-19 ENCOUNTER — Encounter: Payer: Self-pay | Admitting: Pediatrics

## 2016-07-19 ENCOUNTER — Ambulatory Visit (INDEPENDENT_AMBULATORY_CARE_PROVIDER_SITE_OTHER): Payer: BLUE CROSS/BLUE SHIELD | Admitting: Pediatrics

## 2016-07-19 VITALS — BP 100/69 | HR 85 | Temp 97.7°F | Ht 67.0 in | Wt 131.4 lb

## 2016-07-19 DIAGNOSIS — J069 Acute upper respiratory infection, unspecified: Secondary | ICD-10-CM

## 2016-07-19 DIAGNOSIS — J029 Acute pharyngitis, unspecified: Secondary | ICD-10-CM

## 2016-07-19 DIAGNOSIS — H9201 Otalgia, right ear: Secondary | ICD-10-CM | POA: Diagnosis not present

## 2016-07-19 LAB — RAPID STREP SCREEN (MED CTR MEBANE ONLY): Strep Gp A Ag, IA W/Reflex: NEGATIVE

## 2016-07-19 LAB — CULTURE, GROUP A STREP

## 2016-07-19 MED ORDER — FLUTICASONE PROPIONATE 50 MCG/ACT NA SUSP: 2.0000 | Freq: Every day | NASAL | 6 refills | Status: DC

## 2016-07-19 MED ORDER — AZITHROMYCIN 250 MG PO TABS: ORAL_TABLET | ORAL | 0 refills | Status: DC

## 2016-07-19 NOTE — Progress Notes (Signed)
  Subjective:   Patient ID: Courtney Bush, female    DOB: 02-11-1996, 21 y.o.   MRN: [id number] CC: Adenopathy; Sore Throat; and Ear Pain (Right)  HPI: Courtney Bush is a 21 y.o. female presenting for Adenopathy; Sore Throat; and Ear Pain (Right)  Ear pain started two days ago Has noticed enlarged LN for the past week R side, congestion for the past few days Taking allergy pill daily No fevers Appetite has been ok Feeling tired Sore throat better today, started a couple days ago, feels scratchy now  Relevant past medical, surgical, family and social history reviewed. Allergies and medications reviewed and updated. History  Smoking Status  . Never Smoker  Smokeless Tobacco  . Never Used   ROS: Per HPI   Objective:    BP 100/69   Pulse 85   Temp 97.7 F (36.5 C) (Oral)   Ht 5\' 7"  (1.702 m)   Wt 131 lb 6.4 oz (59.6 kg)   BMI 20.58 kg/m   Wt Readings from Last 3 Encounters:  07/19/16 131 lb 6.4 oz (59.6 kg)  05/30/16 127 lb 9.6 oz (57.9 kg)  05/20/16 127 lb (57.6 kg)    Gen: NAD, alert, cooperative with exam, NCAT EYES: EOMI, no conjunctival injection, or no icterus ENT:  R TM pink, clear fluid present behind TM, L TM pink, no effusion, OP with mild erythema LYMPH: 1cm R sided ant cervical LN, a couple smaller < 1 cm nodes L ant cervical LAD CV: NRRR, normal S1/S2, no murmur, distal pulses 2+ b/l Resp: CTABL, no wheezes, normal WOB Abd: +BS, soft, NTND. no guarding or organomegaly Ext: No edema, warm  Neuro: Alert and oriented  Assessment & Plan:  Courtney Bush was seen today for adenopathy, sore throat and ear pain.  Diagnoses and all orders for this visit:  Sore throat Strep neg, f/u culture If sore throat/LAD not improving RTC for labs -     Rapid strep screen (not at [facility]) -     Culture, Group A Strep  Right ear pain +effusion, start fluticasone nasal spray If ear pain worsens OK to start azithro -     fluticasone (FLONASE) 50 MCG/ACT nasal spray; Place 2  sprays into both nostrils daily. -     azithromycin (ZITHROMAX) 250 MG tablet; Take 2 the first day and then one each day after.  Acute URI Discussed symptomatic care  Follow up plan: 1 week if needed Courtney Krasarol Lakeithia Rasor, MD [facility]

## 2016-07-19 NOTE — Patient Instructions (Signed)
Flonase two sprays in 24h both sides  If ear pain getting worse over next few days, OK to start antibiotic  If not improving in a week, come back

## 2016-07-21 LAB — CULTURE, GROUP A STREP: Strep A Culture: NEGATIVE

## 2016-10-10 ENCOUNTER — Encounter: Payer: Self-pay | Admitting: Nurse Practitioner

## 2016-10-10 ENCOUNTER — Ambulatory Visit (INDEPENDENT_AMBULATORY_CARE_PROVIDER_SITE_OTHER): Payer: BLUE CROSS/BLUE SHIELD | Admitting: Nurse Practitioner

## 2016-10-10 VITALS — BP 109/73 | HR 88 | Temp 97.4°F | Ht 67.0 in | Wt 130.0 lb

## 2016-10-10 DIAGNOSIS — I8392 Asymptomatic varicose veins of left lower extremity: Secondary | ICD-10-CM

## 2016-10-10 NOTE — Progress Notes (Signed)
   Subjective:    Patient ID: Courtney Bush, female    DOB: 04-04-1995, 21 y.o.   MRN: [id number]  HPI Patient in the office with complaints of right leg pain that is constant for the last few weeks.  Patient had vein removal surgery in October 2015 with a clot removed.  Patient states that when her leg starts hurting, she has chest pain and shortness of breath as well.  Pain is described as aching constantly, but when sitting-to-standing, the pain is sharp and radiates up the thigh and makes it difficult to walk.  No recent changes in diet or medications, patient states the only change is less physical activity due to work and school schedule.  Patient is in school for [employer] at [employer].   Review of Systems  Constitutional: Positive for fatigue (for weeks). Negative for activity change and appetite change.  Respiratory: Negative for shortness of breath.   Cardiovascular: Positive for chest pain (with leg discomfort) and palpitations (attributes to anxiety with leg pain).  Endocrine: Negative for cold intolerance and heat intolerance.  Musculoskeletal:       Right leg pain (aching and sharp, stabbing)   Neurological: Positive for numbness (right foot).  All other systems reviewed and are negative.      Objective:   Physical Exam  Constitutional: She is oriented to person, place, and time. She appears well-developed and well-nourished. No distress.  Neck: Normal range of motion. No thyromegaly present.  Cardiovascular: Normal rate, regular rhythm, normal heart sounds and intact distal pulses.   No murmur heard. Pulmonary/Chest: Effort normal and breath sounds normal. No respiratory distress. She has no wheezes.  Abdominal: Soft.  Musculoskeletal: Normal range of motion. She exhibits tenderness (right posterior knee and lower thigh). She exhibits no edema.  Normal muscle strength bilaterally   Neurological: She is alert and oriented to person, place, and time. She has normal  reflexes.  Skin: Skin is warm, dry and intact. No rash noted. No erythema.     Right posterior knee warm to palpation- with palpable varicosities    Psychiatric: She has a normal mood and affect. Her behavior is normal.   BP 109/73   Pulse 88   Temp (!) 97.4 F (36.3 C) (Oral)   Ht 5\' 7"  (1.702 m)   Wt 130 lb (59 kg)   BMI 20.36 kg/m         Assessment & Plan:  1. Varicose veins of left lower extremity Take baby aspirin daily - Ambulatory referral to Vascular Surgery  Mary-Margaret Daphine DeutscherMartin, FNP

## 2016-10-10 NOTE — Patient Instructions (Signed)
Varicose Veins Varicose veins are veins that have become enlarged and twisted. They are usually seen in the legs but can occur in other parts of the body as well. What are the causes? This condition is the result of valves in the veins not working properly. Valves in the veins help to return blood from the leg to the heart. If these valves are damaged, blood flows backward and backs up into the veins in the leg near the skin. This causes the veins to become larger. What increases the risk? People who are on their feet a lot, who are pregnant, or who are overweight are more likely to develop varicose veins. What are the signs or symptoms?  Bulging, twisted-appearing, bluish veins, most commonly found on the legs.  Leg pain or a feeling of heaviness. These symptoms may be worse at the end of the day.  Leg swelling.  Changes in skin color. How is this diagnosed? A health care provider can usually diagnose varicose veins by examining your legs. Your health care provider may also recommend an ultrasound of your leg veins. How is this treated? Most varicose veins can be treated at home.However, other treatments are available for people who have persistent symptoms or want to improve the cosmetic appearance of the varicose veins. These treatment options include:  Sclerotherapy. A solution is injected into the vein to close it off.  Laser treatment. A laser is used to heat the vein to close it off.  Radiofrequency vein ablation. An electrical current produced by radio waves is used to close off the vein.  Phlebectomy. The vein is surgically removed through small incisions made over the varicose vein.  Vein ligation and stripping. The vein is surgically removed through incisions made over the varicose vein after the vein has been tied (ligated). Follow these instructions at home:   Do not stand or sit in one position for long periods of time. Do not sit with your legs crossed. Rest with your  legs raised during the day.  Wear compression stockings as directed by your health care provider. These stockings help to prevent blood clots and reduce swelling in your legs.  Do not wear other tight, encircling garments around your legs, pelvis, or waist.  Walk as much as possible to increase blood flow.  Raise the foot of your bed at night with 2-inch blocks.  If you get a cut in the skin over the vein and the vein bleeds, lie down with your leg raised and press on it with a clean cloth until the bleeding stops. Then place a bandage (dressing) on the cut. See your health care provider if it continues to bleed. Contact a health care provider if:  The skin around your ankle starts to break down.  You have pain, redness, tenderness, or hard swelling in your leg over a vein.  You are uncomfortable because of leg pain. This information is not intended to replace advice given to you by your health care provider. Make sure you discuss any questions you have with your health care provider. Document Released: 12/07/2004 Document Revised: 08/05/2015 Document Reviewed: 08/31/2015 Elsevier Interactive Patient Education  2017 Elsevier Inc.  

## 2016-10-16 ENCOUNTER — Encounter: Payer: Self-pay | Admitting: Vascular Surgery

## 2016-10-17 ENCOUNTER — Encounter: Payer: Self-pay | Admitting: Vascular Surgery

## 2016-10-17 ENCOUNTER — Ambulatory Visit (INDEPENDENT_AMBULATORY_CARE_PROVIDER_SITE_OTHER): Payer: BLUE CROSS/BLUE SHIELD | Admitting: Vascular Surgery

## 2016-10-17 VITALS — BP 111/73 | HR 95 | Temp 97.1°F | Resp 18 | Ht 67.25 in | Wt 132.6 lb

## 2016-10-17 DIAGNOSIS — I83891 Varicose veins of right lower extremities with other complications: Secondary | ICD-10-CM | POA: Diagnosis not present

## 2016-10-17 NOTE — Progress Notes (Signed)
Subjective:     Patient ID: Courtney Bush, female   DOB: Jul 22, 1995, 21 y.o.   MRN: [id number]  HPI this 21 year old female returns for further evaluation regarding painful varicosities in the posterior right thigh and calf. She had a procedure by me in 2016 which consisted of stab phlebectomy only a varicosities in this area. Her small saphenous vein which continues into the distal thigh was not accessible and appeared to be thrombosed in the thigh area so laser ablation was not performed. Initially she had complete resolution of the varicosities but now she returns with recurrent varicosities which are painful. She develops discomfort as the day progresses. She does wear elastic compression stockings which relieved the symptoms somewhat when she is on her feet but sent as a stockings are removed the discomfort returns. She has no history of DVT thrombophlebitis stasis ulcers or bleeding. She has no symptoms in the left leg.  Past Medical History:  Diagnosis Date  . Allergy    factor fived deficiency  . Asthma     Social History  Substance Use Topics  . Smoking status: Never Smoker  . Smokeless tobacco: Never Used  . Alcohol use No    Family History  Problem Relation Age of Onset  . Hypertension Father   . Hyperlipidemia Father     Allergies  Allergen Reactions  . Other Hives and Rash    Tree nuts     Current Outpatient Prescriptions:  .  albuterol (PROVENTIL HFA;VENTOLIN HFA) 108 (90 BASE) MCG/ACT inhaler, Inhale 2 puffs into the lungs every 6 (six) hours as needed for wheezing., Disp: 1 Inhaler, Rfl: 2 .  EPINEPHrine (EPIPEN) 0.3 mg/0.3 mL SOAJ, Inject 0.3 mLs (0.3 mg total) into the muscle once., Disp: 1 Device, Rfl: 1 .  fluticasone (FLONASE) 50 MCG/ACT nasal spray, Place 2 sprays into both nostrils daily., Disp: 16 g, Rfl: 6 .  rizatriptan (MAXALT) 10 MG tablet, Take 1 tablet (10 mg total) by mouth as needed for migraine. May repeat in 2 hours if needed, Disp: 10 tablet,  Rfl: 0  Vitals:   10/17/16 1315  BP: 111/73  Pulse: 95  Resp: 18  Temp: (!) 97.1 F (36.2 C)  TempSrc: Oral  SpO2: 100%  Weight: 132 lb 9.6 oz (60.1 kg)  Height: 5' 7.25" (1.708 m)    Body mass index is 20.61 kg/m.        Review of Systems Patient does have factor V Leiden deficiency. Denies chest pain, dyspnea on exertion.    Objective:   Physical Exam BP 111/73 (BP Location: Left Arm, Patient Position: Sitting, Cuff Size: Normal)   Pulse 95   Temp (!) 97.1 F (36.2 C) (Oral)   Resp 18   Ht 5' 7.25" (1.708 m)   Wt 132 lb 9.6 oz (60.1 kg)   SpO2 100%   BMI 20.61 kg/m   Gen. healthy-appearing female no apparent distress alert and oriented 3 Lungs no rhonchi or wheezing Right leg with varicosities in the very distal thigh communicating with a prominent varix in the popliteal fossa. No distal varicosities noted. No hyperpigmentation or ulceration right leg noted.  Today I performed a bedside sono site exam of the small and great saphenous veins. The great saphenous vein is somewhat enlarged. The small saphenous vein appears to be patent and the calf supplying these painful varicosities but may be occluded-noncompressible in the distal thigh-uncertain     Assessment:     #1 recurrent painful varicosities right popliteal fossa  status post multiple stab phlebectomy 2 years ago causing symptoms which are affecting patient's daily living #2 factor V Leiden deficiency    Plan:     Patient will have formal venous reflux exam and return to see me for further discussion next week

## 2016-10-17 NOTE — Addendum Note (Signed)
Addended by: Burton ApleyPETTY, Gifford Ballon A on: 10/17/2016 03:04 PM   Modules accepted: Orders

## 2016-10-23 ENCOUNTER — Encounter: Payer: Self-pay | Admitting: Vascular Surgery

## 2016-10-24 ENCOUNTER — Encounter: Payer: Self-pay | Admitting: Vascular Surgery

## 2016-10-24 ENCOUNTER — Ambulatory Visit (INDEPENDENT_AMBULATORY_CARE_PROVIDER_SITE_OTHER): Payer: BLUE CROSS/BLUE SHIELD | Admitting: Vascular Surgery

## 2016-10-24 ENCOUNTER — Ambulatory Visit (HOSPITAL_COMMUNITY)
Admission: RE | Admit: 2016-10-24 | Discharge: 2016-10-24 | Disposition: A | Discharge status: Home / Self Care | Payer: BLUE CROSS/BLUE SHIELD | Source: Ambulatory Visit | Attending: Vascular Surgery | Admitting: Vascular Surgery

## 2016-10-24 VITALS — BP 105/71 | HR 79 | Temp 97.6°F | Resp 16 | Ht 67.0 in | Wt 133.0 lb

## 2016-10-24 DIAGNOSIS — I83891 Varicose veins of right lower extremities with other complications: Secondary | ICD-10-CM | POA: Insufficient documentation

## 2016-10-24 DIAGNOSIS — R609 Edema, unspecified: Secondary | ICD-10-CM | POA: Insufficient documentation

## 2016-10-24 NOTE — Progress Notes (Signed)
Subjective:     Patient ID: Courtney Bush, female   DOB: 09-Nov-1995, 21 y.o.   MRN: [id number]  HPI This 21 year old female returns continued follow-up regarding her painful recurrent varicosities in the right leg. She underwent stab phlebectomy only in 2016. At that time her small saphenous vein was demonstrated to have gross reflux prior to the procedure and did not communicate with the popliteal vein and extended up into the thigh posteriorly. The vein was not accessible at that time therefore only stab phlebectomy was performed. She did well for about one year or so and then developed recurrent painful varicosities in the same location. She has no history of DVT thrombophlebitis stasis ulcers or bleeding. She does have factor V Leiden.  Past Medical History:  Diagnosis Date  . Allergy    factor fived deficiency  . Asthma     Social History  Substance Use Topics  . Smoking status: Never Smoker  . Smokeless tobacco: Never Used  . Alcohol use No    Family History  Problem Relation Age of Onset  . Hypertension Father   . Hyperlipidemia Father     Allergies  Allergen Reactions  . Other Hives and Rash    Tree nuts     Current Outpatient Prescriptions:  .  albuterol (PROVENTIL HFA;VENTOLIN HFA) 108 (90 BASE) MCG/ACT inhaler, Inhale 2 puffs into the lungs every 6 (six) hours as needed for wheezing., Disp: 1 Inhaler, Rfl: 2 .  EPINEPHrine (EPIPEN) 0.3 mg/0.3 mL SOAJ, Inject 0.3 mLs (0.3 mg total) into the muscle once., Disp: 1 Device, Rfl: 1 .  fluticasone (FLONASE) 50 MCG/ACT nasal spray, Place 2 sprays into both nostrils daily., Disp: 16 g, Rfl: 6 .  rizatriptan (MAXALT) 10 MG tablet, Take 1 tablet (10 mg total) by mouth as needed for migraine. May repeat in 2 hours if needed, Disp: 10 tablet, Rfl: 0  Vitals:   10/24/16 1050  BP: 105/71  Pulse: 79  Resp: 16  Temp: 97.6 F (36.4 C)  TempSrc: Oral  SpO2: 99%  Weight: 133 lb (60.3 kg)  Height: 5\' 7"  (1.702 m)    Body  mass index is 20.83 kg/m.         Review of Systems Denies chest pain, dyspnea on exertion, PND, orthopnea    Objective:   Physical Exam BP 105/71 (BP Location: Left Arm, Patient Position: Sitting, Cuff Size: Normal)   Pulse 79   Temp 97.6 F (36.4 C) (Oral)   Resp 16   Ht 5\' 7"  (1.702 m)   Wt 133 lb (60.3 kg)   SpO2 99%   BMI 20.83 kg/m   Gen. well-developed healthy female in no apparent distress alert and oriented 3 Right leg with bulging varicosities up to 4 mm in size in the popliteal extending into the distal thigh with no anterior or distal varicosities noted. No hyperpigmentation or ulceration noted. 3+ to cells pedis pulse palpable.  I ordered a venous duplex exam the right leg which I reviewed and interpreted. Also performed a bedside SonoSite ultrasound exam for comparison. There is no reflux in the right small saphenous vein which is a very small vein except for one area of dilatation in the area of the popliteal crease. This shows up better with the patient standing. The great saphenous vein on the right is normal. There is no DVT.     Assessment:     Recurrent varicosities right distal posterior thigh and popliteal area with no obvious reflux in right  small saphenous vein and normal appearing right great saphenous vein    Plan:     Feel best step at this point would be foam sclerotherapy to try to eliminate these recurrent varicosities I did explain to patient and her mother that it is not 100% that this will be successful and that she could still need stab phlebectomy in the future if this is not successful I feel this would be the best next step in this patient and we will proceed in the near future

## 2016-12-23 IMAGING — US US EXTREM LOW VENOUS*R*
1 series · 13 of 24 positions shown · non-contrast
Comparison: CT Abdomen and Pelvis 11/21/2013.

CLINICAL DATA: 20-year-old female with pain radiating to the right
lower extremity and knee for 3 days. Congenital coagulopathy.
Initial encounter.



[Series 1: us extrem low venous*right* · 0.07mm/px · 13 of 52 slices shown]
[im 1/52]
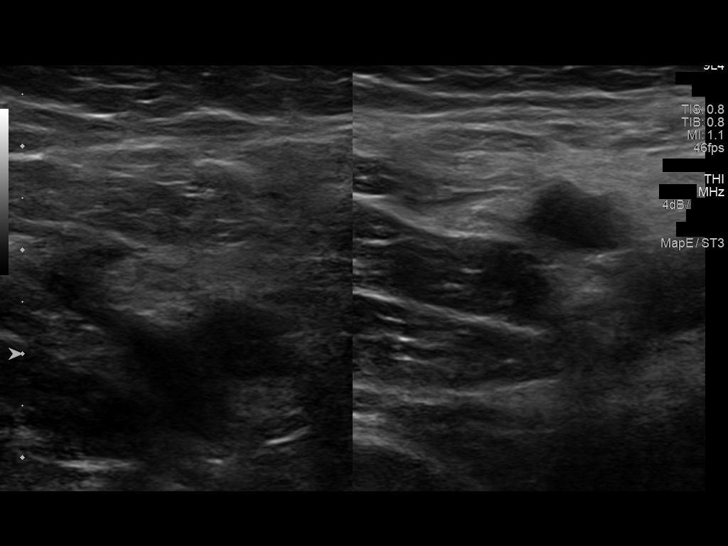
[im 5/52]
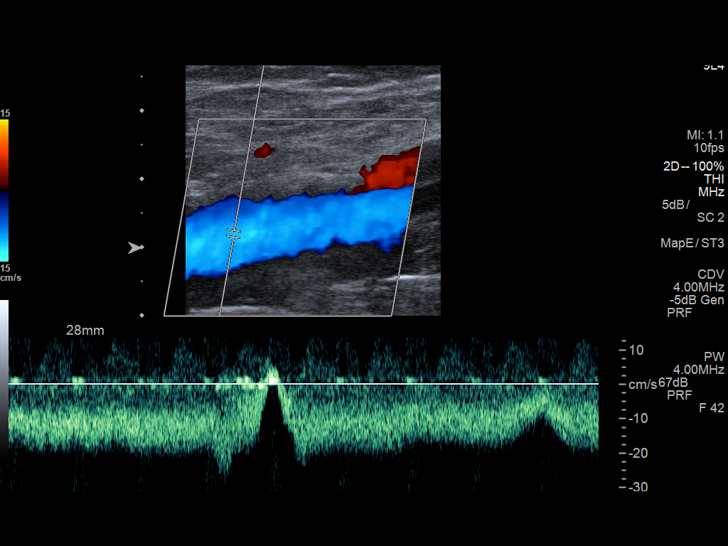
[im 9/52]
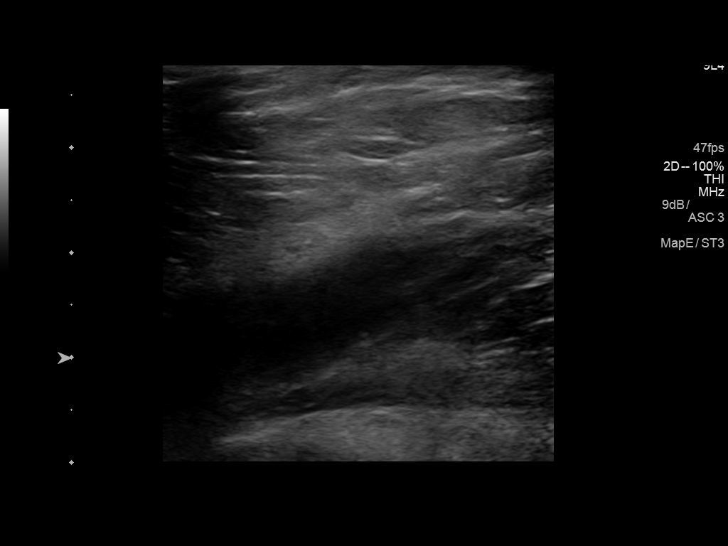
[im 14/52]
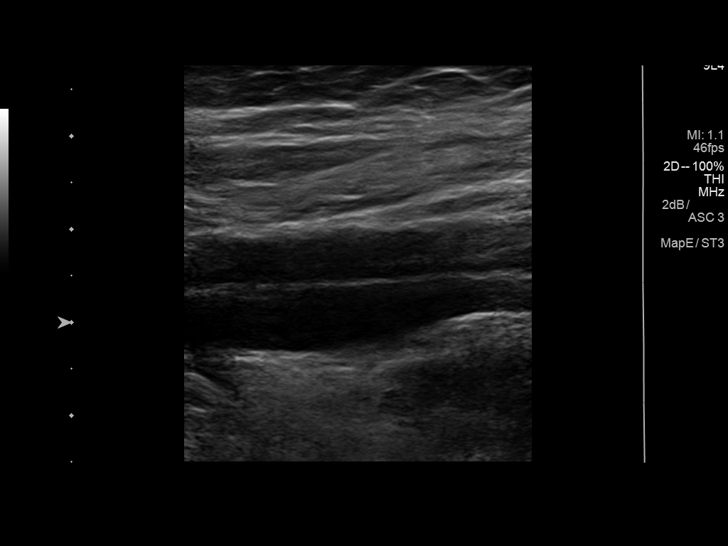
[im 18/52]
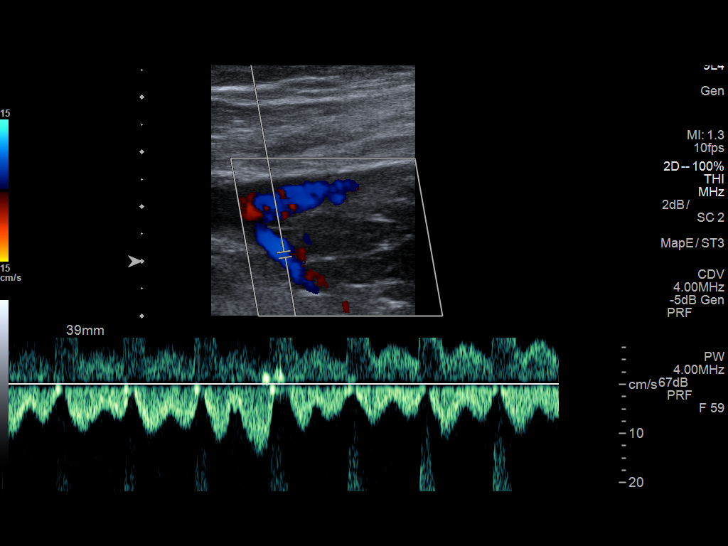
[im 23/52]
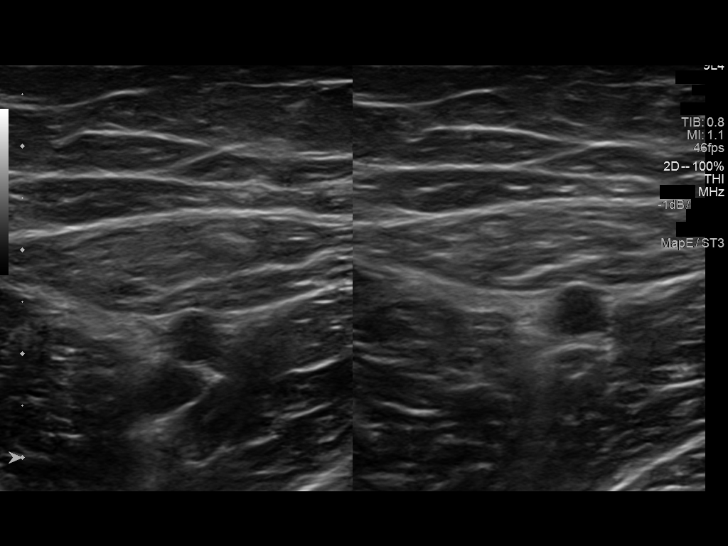
[im 27/52]
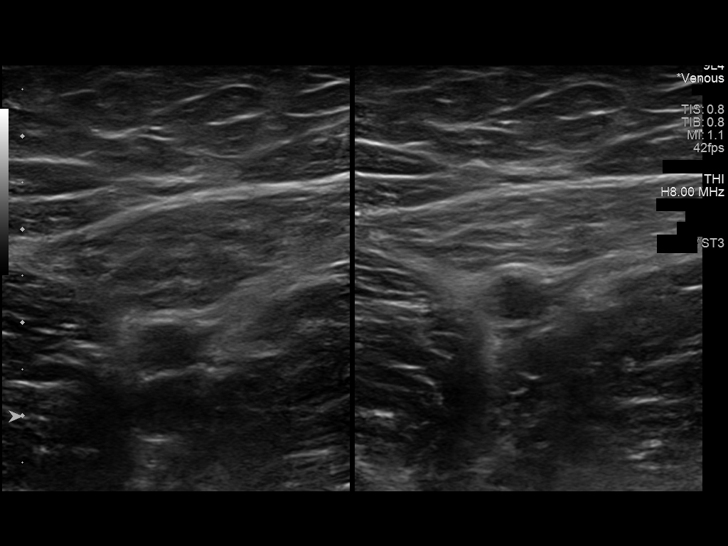
[im 29/52]
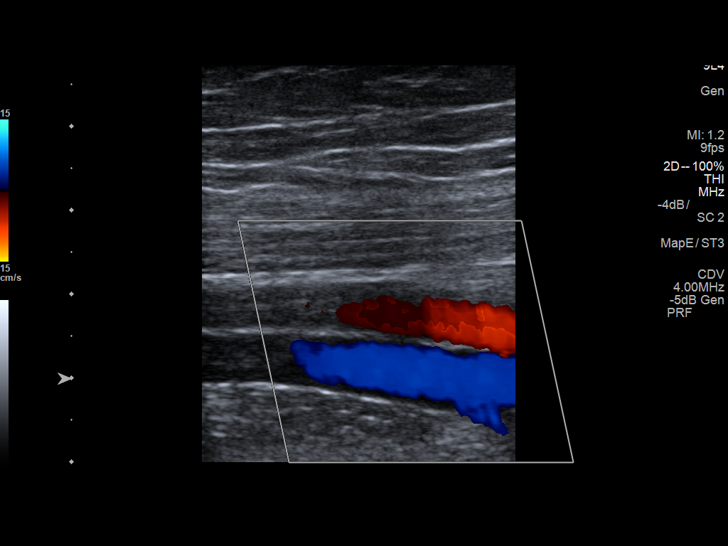
[im 34/52]
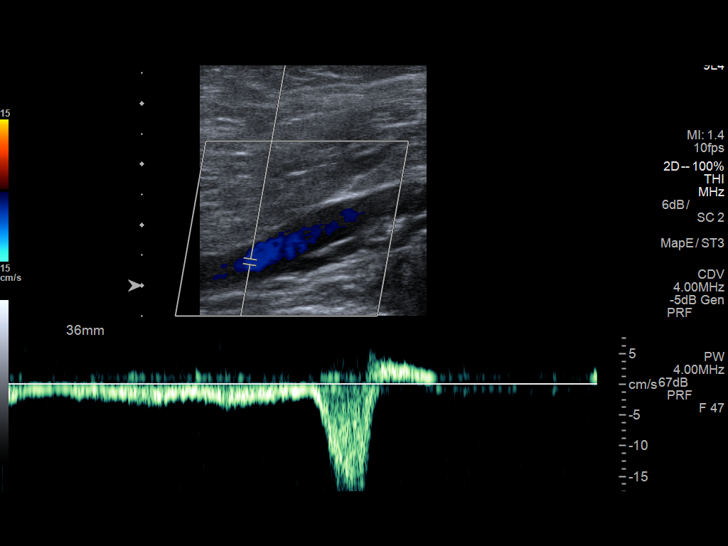
[im 38/52]
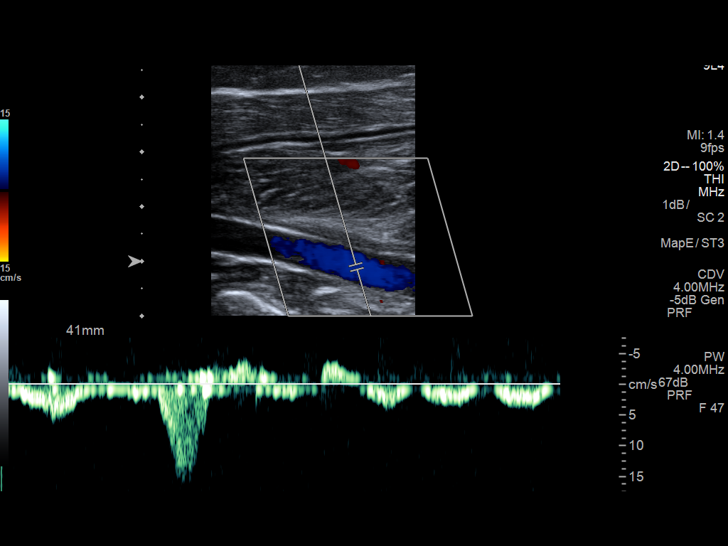
[im 43/52]
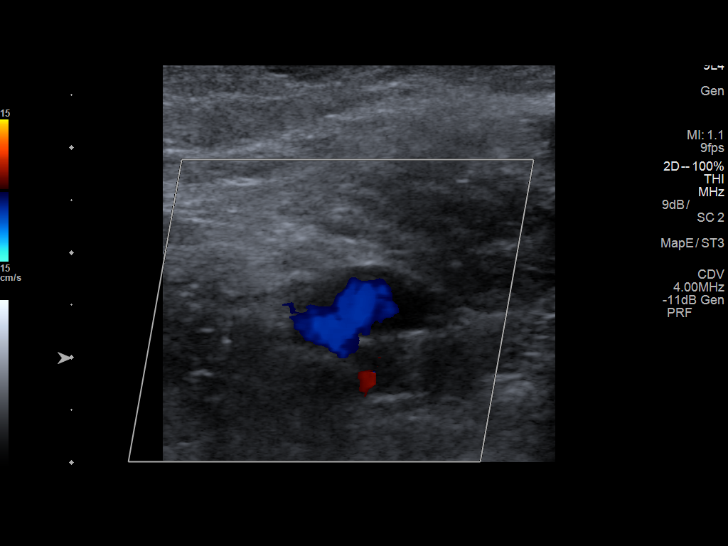
[im 47/52]
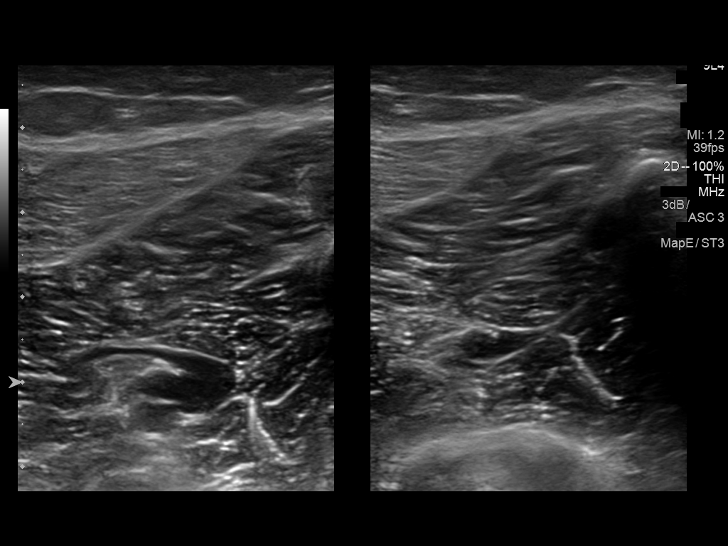
[im 52/52]
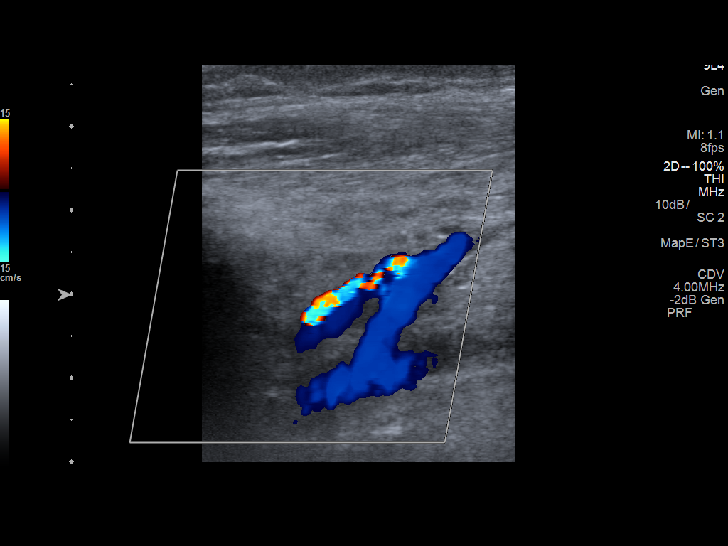

[13 of 24 positions shown; findings below may reference images not displayed]

FINDINGS: Contralateral Common Femoral Vein: Respiratory phasicity is normal
and symmetric with the symptomatic side. No evidence of thrombus.
Normal compressibility.

Common Femoral Vein: No evidence of thrombus. Normal
compressibility, respiratory phasicity and response to augmentation.

Saphenofemoral Junction: No evidence of thrombus. Normal
compressibility and flow on color Doppler imaging.

Profunda Femoral Vein: No evidence of thrombus. Normal
compressibility and flow on color Doppler imaging.

Femoral Vein: No evidence of thrombus. Normal compressibility,
respiratory phasicity and response to augmentation.

Popliteal Vein: No evidence of thrombus. Normal compressibility,
respiratory phasicity and response to augmentation.

Calf Veins: No evidence of thrombus. Normal compressibility and flow
on color Doppler imaging.

Superficial Great Saphenous Vein: No evidence of thrombus. Normal
compressibility and flow on color Doppler imaging.

Venous Reflux:  None.

Other Findings: Venous varicosities evident in the popliteal fossa
(image 43). These are compressible, no superficial venous thrombosis
identified.
IMPRESSION: 1.  No evidence of right lower extremity deep venous thrombosis.
2. Venous varicosities in the popliteal fossa, no superficial venous
thrombosis identified.

## 2016-12-28 ENCOUNTER — Ambulatory Visit: Payer: BLUE CROSS/BLUE SHIELD

## 2016-12-29 ENCOUNTER — Ambulatory Visit (INDEPENDENT_AMBULATORY_CARE_PROVIDER_SITE_OTHER): Payer: BLUE CROSS/BLUE SHIELD | Admitting: *Deleted

## 2016-12-29 DIAGNOSIS — Z23 Encounter for immunization: Secondary | ICD-10-CM

## 2017-01-17 ENCOUNTER — Encounter: Payer: Self-pay | Admitting: Pediatrics

## 2017-01-17 ENCOUNTER — Ambulatory Visit: Payer: BLUE CROSS/BLUE SHIELD | Admitting: Pediatrics

## 2017-01-17 VITALS — BP 103/66 | HR 79 | Temp 97.6°F | Resp 18 | Ht 67.0 in | Wt 135.4 lb

## 2017-01-17 DIAGNOSIS — D6851 Activated protein C resistance: Secondary | ICD-10-CM

## 2017-01-17 DIAGNOSIS — R590 Localized enlarged lymph nodes: Secondary | ICD-10-CM

## 2017-01-17 DIAGNOSIS — R0789 Other chest pain: Secondary | ICD-10-CM | POA: Diagnosis not present

## 2017-01-17 DIAGNOSIS — Z91018 Allergy to other foods: Secondary | ICD-10-CM | POA: Insufficient documentation

## 2017-01-17 NOTE — Progress Notes (Signed)
  Subjective:   Patient ID: Courtney Bush, female    DOB: May 02, 1995, 21 y.o.   MRN: [id number] CC: Adenopathy  HPI: Courtney Bush is a 21 y.o. female presenting for Adenopathy  Sweating every night for the past week Has had them off and on for a few months Feeling tired throughout day No sore throat recently No allergies this fall Takes allergy pill daily  continues to have enlarged lymph node on R side, has been there since May Was treated with azithromycin at that time for ongoing effusion R TM Swallowing no longer is sore  Appetite has been fine Throat feels tight at times  Has alpha-gal allergy, never needed epinephrine but has available  Has had chest wall pain when she takes deep breaths for past two weeks Has been coughing some off and on H/o asthma, has albuterol at home Not regularly needing it since exercising regularly in highschool   Relevant past medical, surgical, family and social history reviewed. Allergies and medications reviewed and updated. Social History   Tobacco Use  Smoking Status Never Smoker  Smokeless Tobacco Never Used   ROS: Per HPI   Objective:    BP 103/66   Pulse 79   Temp 97.6 F (36.4 C) (Oral)   Resp 18   Ht 5\' 7"  (1.702 m)   Wt 135 lb 6.4 oz (61.4 kg)   SpO2 100%   BMI 21.21 kg/m   Wt Readings from Last 3 Encounters:  01/17/17 135 lb 6.4 oz (61.4 kg)  10/24/16 133 lb (60.3 kg)  10/17/16 132 lb 9.6 oz (60.1 kg)    Gen: NAD, alert, cooperative with exam, NCAT EYES: EOMI, no conjunctival injection, or no icterus ENT: R TM with clear effusion, L TM nl, OP without erythema LYMPH: R submandicular LN apprx 2cm, mildly ttp, other cervical LN present b/l, < 1cm No LAD axillary, inguinal regions CV: NRRR, normal S1/S2, no murmur, distal pulses 2+ b/l Resp: CTABL, no wheezes, normal WOB Abd: +BS, soft, NTND. no guarding or organomegaly Ext: No edema, warm Neuro: Alert and oriented MSK: mild ttp along sternal  borders  Assessment & Plan:  Courtney Bush was seen today for adenopathy.  Diagnoses and all orders for this visit:  Enlarged lymph node in neck -     Epstein-Barr virus VCA antibody panel -     CBC with Differential/Platelet -     Ambulatory referral to ENT  Factor V Leiden (HCC)  Chest tightness Coughing more past couple of weeks, lung exam normal, mild chest wall tenderness with palpation, does have h/o asthma, no wheezing today Use albuteorl prn to help with cough Ibuprofen next few days If not improving let me know, return precautions discussed  Allergy to alpha-gal   Follow up plan: Return in about 4 weeks (around 02/14/2017). Rex Krasarol Vincent, MD [facility]

## 2017-01-18 ENCOUNTER — Telehealth: Payer: Self-pay | Admitting: Pediatrics

## 2017-01-18 LAB — CBC WITH DIFFERENTIAL/PLATELET
Basophils Absolute: 0 10*3/uL (ref 0.0–0.2)
Basos: 0 %
EOS (ABSOLUTE): 0.4 10*3/uL (ref 0.0–0.4)
Eos: 6 %
Hematocrit: 40.2 % (ref 34.0–46.6)
Hemoglobin: 13.5 g/dL (ref 11.1–15.9)
Immature Grans (Abs): 0 10*3/uL (ref 0.0–0.1)
Immature Granulocytes: 0 %
Lymphocytes Absolute: 2.7 10*3/uL (ref 0.7–3.1)
Lymphs: 40 %
MCH: 30.9 pg (ref 26.6–33.0)
MCHC: 33.6 g/dL (ref 31.5–35.7)
MCV: 92 fL (ref 79–97)
Monocytes Absolute: 0.4 10*3/uL (ref 0.1–0.9)
Monocytes: 7 %
Neutrophils Absolute: 3.3 10*3/uL (ref 1.4–7.0)
Neutrophils: 47 %
Platelets: 293 10*3/uL (ref 150–379)
RBC: 4.37 x10E6/uL (ref 3.77–5.28)
RDW: 12.7 % (ref 12.3–15.4)
WBC: 6.8 10*3/uL (ref 3.4–10.8)

## 2017-01-18 LAB — EPSTEIN-BARR VIRUS VCA ANTIBODY PANEL
EBV Early Antigen Ab, IgG: 29.8 U/mL — ABNORMAL HIGH (ref 0.0–8.9)
EBV NA IgG: 568 U/mL — ABNORMAL HIGH (ref 0.0–17.9)
EBV VCA IgG: 103 U/mL — ABNORMAL HIGH (ref 0.0–17.9)
EBV VCA IgM: 52.2 U/mL — ABNORMAL HIGH (ref 0.0–35.9)

## 2017-01-18 NOTE — Telephone Encounter (Signed)
Lmtcb. Will close encounter since there is an encounter in test results.

## 2017-01-18 NOTE — Telephone Encounter (Signed)
Result note now in 

## 2017-01-23 DIAGNOSIS — J343 Hypertrophy of nasal turbinates: Secondary | ICD-10-CM | POA: Diagnosis not present

## 2017-01-23 DIAGNOSIS — F17210 Nicotine dependence, cigarettes, uncomplicated: Secondary | ICD-10-CM | POA: Diagnosis not present

## 2017-01-23 DIAGNOSIS — R590 Localized enlarged lymph nodes: Secondary | ICD-10-CM | POA: Diagnosis not present

## 2017-01-23 DIAGNOSIS — Z7289 Other problems related to lifestyle: Secondary | ICD-10-CM | POA: Diagnosis not present

## 2017-02-13 ENCOUNTER — Emergency Department (HOSPITAL_COMMUNITY)
Admission: EM | Admit: 2017-02-13 | Discharge: 2017-02-13 | Disposition: A | Discharge status: Home / Self Care | Payer: BLUE CROSS/BLUE SHIELD | Attending: Emergency Medicine | Admitting: Emergency Medicine

## 2017-02-13 ENCOUNTER — Emergency Department (HOSPITAL_COMMUNITY): Payer: BLUE CROSS/BLUE SHIELD

## 2017-02-13 ENCOUNTER — Encounter (HOSPITAL_COMMUNITY): Payer: Self-pay

## 2017-02-13 DIAGNOSIS — R55 Syncope and collapse: Secondary | ICD-10-CM | POA: Diagnosis not present

## 2017-02-13 DIAGNOSIS — G43909 Migraine, unspecified, not intractable, without status migrainosus: Secondary | ICD-10-CM | POA: Diagnosis not present

## 2017-02-13 DIAGNOSIS — J45909 Unspecified asthma, uncomplicated: Secondary | ICD-10-CM | POA: Insufficient documentation

## 2017-02-13 DIAGNOSIS — Z79899 Other long term (current) drug therapy: Secondary | ICD-10-CM | POA: Insufficient documentation

## 2017-02-13 DIAGNOSIS — G40909 Epilepsy, unspecified, not intractable, without status epilepticus: Secondary | ICD-10-CM | POA: Diagnosis not present

## 2017-02-13 DIAGNOSIS — G4489 Other headache syndrome: Secondary | ICD-10-CM | POA: Diagnosis not present

## 2017-02-13 DIAGNOSIS — R51 Headache: Secondary | ICD-10-CM | POA: Diagnosis not present

## 2017-02-13 HISTORY — DX: Hereditary deficiency of other clotting factors: D68.2

## 2017-02-13 HISTORY — DX: Migraine, unspecified, not intractable, without status migrainosus: G43.909

## 2017-02-13 LAB — CBC
HCT: 40.3 % (ref 36.0–46.0)
Hemoglobin: 13.4 g/dL (ref 12.0–15.0)
MCH: 30.8 pg (ref 26.0–34.0)
MCHC: 33.3 g/dL (ref 30.0–36.0)
MCV: 92.6 fL (ref 78.0–100.0)
Platelets: 290 10*3/uL (ref 150–400)
RBC: 4.35 MIL/uL (ref 3.87–5.11)
RDW: 11.8 % (ref 11.5–15.5)
WBC: 5.6 10*3/uL (ref 4.0–10.5)

## 2017-02-13 LAB — BASIC METABOLIC PANEL
Anion gap: 6 (ref 5–15)
BUN: 13 mg/dL (ref 6–20)
CO2: 27 mmol/L (ref 22–32)
Calcium: 9.3 mg/dL (ref 8.9–10.3)
Chloride: 105 mmol/L (ref 101–111)
Creatinine, Ser: 0.91 mg/dL (ref 0.44–1.00)
GFR calc Af Amer: >60 mL/min (ref >60)
GFR calc non Af Amer: >60 mL/min (ref >60)
Glucose, Bld: 95 mg/dL (ref 65–99)
Potassium: 4 mmol/L (ref 3.5–5.1)
Sodium: 138 mmol/L (ref 135–145)

## 2017-02-13 LAB — URINALYSIS, ROUTINE W REFLEX MICROSCOPIC
Bilirubin Urine: NEGATIVE
Glucose, UA: NEGATIVE mg/dL
Ketones, ur: NEGATIVE mg/dL
Nitrite: NEGATIVE
Protein, ur: NEGATIVE mg/dL
Specific Gravity, Urine: 1.014 (ref 1.005–1.030)
pH: 5 (ref 5.0–8.0)

## 2017-02-13 LAB — I-STAT BETA HCG BLOOD, ED (MC, WL, AP ONLY): I-stat hCG, quantitative: <5 m[IU]/mL (ref <5)

## 2017-02-13 LAB — CBG MONITORING, ED: Glucose-Capillary: 89 mg/dL (ref 65–99)

## 2017-02-13 MED ORDER — METOCLOPRAMIDE HCL 10 MG PO TABS
5.0000 mg | ORAL_TABLET | Freq: Once | ORAL | Status: AC
  Administered 2017-02-13: 5 mg via ORAL
  Filled 2017-02-13: qty 1

## 2017-02-13 MED ORDER — KETOROLAC TROMETHAMINE 30 MG/ML IJ SOLN
30.0000 mg | Freq: Once | INTRAMUSCULAR | Status: AC
  Administered 2017-02-13: 30 mg via INTRAVENOUS
  Filled 2017-02-13: qty 1

## 2017-02-13 MED ORDER — DIPHENHYDRAMINE HCL 25 MG PO CAPS
25.0000 mg | ORAL_CAPSULE | Freq: Once | ORAL | Status: AC
  Administered 2017-02-13: 25 mg via ORAL
  Filled 2017-02-13: qty 1

## 2017-02-13 NOTE — ED Notes (Signed)
ED provider at bedside for reeval

## 2017-02-13 NOTE — ED Triage Notes (Signed)
Patient here with father who reports ongoing "migraine" headaches for weeks. Reports that she had headache x 2 days that resolved and than this am the headache returned. Patient here to be evaluated for possible seizure vs. Syncopal event. On arrival alert and oriented, no incontinence, no tongue trauma. Reports ongoing left sided headache

## 2017-02-13 NOTE — ED Notes (Signed)
ED Provider at bedside for re-eval 

## 2017-02-13 NOTE — ED Provider Notes (Signed)
[facility] EMERGENCY DEPARTMENT Provider Note   CSN: [id number] Arrival date & time: 02/13/17  1355     History   Chief Complaint Chief Complaint  Patient presents with  . headache/near syncope/seizure?    HPI Courtney Bush is a 21 y.o. female.  HPI   21 year old female presents today with complaints of migraine.  She notes a significant past medical history of the same.  She reports over the last several days she has had ongoing migraines starting in the morning lasting throughout the day.  She notes she had a recurrence of symptoms this morning upon awakening.  She took one Advil which did not improve her symptoms.  Patient notes this afternoon she was with her boyfriend when her headache got more severe, she notes she had a syncopal episode, did not fall.  Her significant other reported she shook for approximately 10 seconds throughout the episode, no postictal phase.  She denies any history of seizures.  She notes she still has an ongoing left-sided migraine, no neurological deficits, no fever, neck pain or any other red flags.  Patient denies any chest pain, shortness of breath.   Past Medical History:  Diagnosis Date  . Allergy    factor fived deficiency  . Asthma   . Factor V deficiency (HCC)   . Migraine     Patient Active Problem List   Diagnosis Date Noted  . Factor V Leiden (HCC) 01/17/2017  . Allergy to alpha-gal 01/17/2017  . Varicose veins of right lower extremity with complications 09/06/2015  . Varicose veins of right lower extremity 12/28/2014  . Migraine 12/03/2014  . Tree nut allergy 12/03/2014  . Allergic rhinitis 12/03/2014  . Varicose veins of leg with complications 10/27/2014    Past Surgical History:  Procedure Laterality Date  . TONSILLECTOMY      OB History    No data available       Home Medications    Prior to Admission medications   Medication Sig Start Date End Date Taking? Authorizing Provider  albuterol  (PROVENTIL HFA;VENTOLIN HFA) 108 (90 BASE) MCG/ACT inhaler Inhale 2 puffs into the lungs every 6 (six) hours as needed for wheezing. 10/04/12  Yes Floydene Flock, MD  EPINEPHrine (EPIPEN) 0.3 mg/0.3 mL SOAJ Inject 0.3 mLs (0.3 mg total) into the muscle once. 10/04/12  Yes Floydene Flock, MD  ibuprofen (ADVIL,MOTRIN) 200 MG tablet Take 200 mg by mouth every 6 (six) hours as needed for moderate pain.   Yes [provider]  rizatriptan (MAXALT) 10 MG tablet Take 1 tablet (10 mg total) by mouth as needed for migraine. May repeat in 2 hours if needed 05/20/16  Yes Remus Loffler, PA-C  fluticasone (FLONASE) 50 MCG/ACT nasal spray Place 2 sprays into both nostrils daily. Patient not taking: Reported on 02/13/2017 07/19/16   Johna Sheriff, MD    Family History Family History  Problem Relation Age of Onset  . Hypertension Father   . Hyperlipidemia Father     Social History Social History   Tobacco Use  . Smoking status: Never Smoker  . Smokeless tobacco: Never Used  Substance Use Topics  . Alcohol use: No    Alcohol/week: 0.0 oz  . Drug use: No     Allergies   Alpha-gal and Other   Review of Systems Review of Systems  All other systems reviewed and are negative.    Physical Exam Updated Vital Signs BP 104/69   Pulse 70  Temp 97.9 F (36.6 C) (Oral)   Resp 16   SpO2 99%   Physical Exam  Constitutional: She is oriented to person, place, and time. She appears well-developed and well-nourished. No distress.  HENT:  Head: Normocephalic and atraumatic.  Eyes: Conjunctivae and EOM are normal. Pupils are equal, round, and reactive to light. Right eye exhibits no discharge. Left eye exhibits no discharge. No scleral icterus.  Neck: Normal range of motion. Neck supple. No JVD present. No tracheal deviation present.  Cardiovascular: Normal rate, regular rhythm, normal heart sounds and intact distal pulses. Exam reveals no gallop and no friction rub.  No murmur  heard. Pulmonary/Chest: Effort normal. No stridor.  Musculoskeletal: Normal range of motion. She exhibits no edema or tenderness.  Lymphadenopathy:    She has no cervical adenopathy.  Neurological: She is alert and oriented to person, place, and time. She has normal strength. She displays no atrophy and no tremor. No cranial nerve deficit or sensory deficit. She exhibits normal muscle tone. She displays a negative Romberg sign. She displays no seizure activity. Coordination and gait normal. GCS eye subscore is 4. GCS verbal subscore is 5. GCS motor subscore is 6.  Skin: She is not diaphoretic.  Psychiatric: She has a normal mood and affect. Her behavior is normal. Judgment and thought content normal.  Nursing note and vitals reviewed.    ED Treatments / Results  Labs (all labs ordered are listed, but only abnormal results are displayed) Labs Reviewed  URINALYSIS, ROUTINE W REFLEX MICROSCOPIC - Abnormal; Notable for the following components:      Result Value   Hgb urine dipstick MODERATE (*)    Leukocytes, UA TRACE (*)    Bacteria, UA RARE (*)    Squamous Epithelial / LPF 0-5 (*)    All other components within normal limits  BASIC METABOLIC PANEL  CBC  CBG MONITORING, ED  I-STAT BETA HCG BLOOD, ED ([facility], [facility], [facility] ONLY)    EKG  EKG Interpretation  Date/Time:  Tuesday February 13 2017 14:11:22 EST Ventricular Rate:  86 PR Interval:  138 QRS Duration: 82 QT Interval:  356 QTC Calculation: 426 R Axis:   89 Text Interpretation:  Normal sinus rhythm Nonspecific ST abnormality Abnormal ECG Confirmed by Rolan BuccoBelfi, Melanie [phone]([id number]) on 02/13/2017 7:29:01 PM       Radiology Ct Head Wo Contrast  Result Date: 02/13/2017 CLINICAL DATA:  Headaches for several weeks EXAM: CT HEAD WITHOUT CONTRAST TECHNIQUE: Contiguous axial images were obtained from the base of the skull through the vertex without intravenous contrast. COMPARISON:  11/21/2013 FINDINGS: Brain: No evidence of acute infarction,  hemorrhage, hydrocephalus, extra-axial collection or mass lesion/mass effect. Vascular: No hyperdense vessel or unexpected calcification. Skull: Normal. Negative for fracture or focal lesion. Sinuses/Orbits: Mild mucosal thickening is noted within the maxillary antra bilaterally. Other: None. IMPRESSION: No acute intracranial abnormality is noted. Mucosal thickening of the maxillary antra is noted bilaterally. Electronically Signed   By: Alcide CleverMark  Lukens M.D.   On: 02/13/2017 16:47    Procedures Procedures (including critical care time)  Medications Ordered in ED Medications  ketorolac (TORADOL) 30 MG/ML injection 30 mg (30 mg Intravenous Given 02/13/17 1714)  metoCLOPramide (REGLAN) tablet 5 mg (5 mg Oral Given 02/13/17 1713)  diphenhydrAMINE (BENADRYL) capsule 25 mg (25 mg Oral Given 02/13/17 1713)     Initial Impression / Assessment and Plan / ED Course  I have reviewed the triage vital signs and the nursing notes.  Pertinent labs & imaging results that  were available during my care of the patient were reviewed by me and considered in my medical decision making (see chart for details).     Final Clinical Impressions(s) / ED Diagnoses   Final diagnoses:  Syncope, unspecified syncope type  Migraine without status migrainosus, not intractable, unspecified migraine type    Labs: UA, I stat beta Hcg, BMP, CBC, CBG  Imaging: ED EKG  Consults:  Therapeutics: Toradol, Reglan, Benadryl  Discharge Meds:   Assessment/Plan: 21 year old female presents today with migraine and syncope.  She is well-appearing with a very minimal headache at this time.  Patient has no neurological deficits, migraine feels similar to previous.  Patient will have head CT, orthostatic vital signs.  Likely vasovagal episode earlier today, low suspicion for seizure, reassuring laboratory analysis.  Patient's CT shows no significant findings labs are reassuring.  Patient does not have any neurological deficits.  I have  very low suspicion for acute intracranial abnormality, low suspicion for cardiac source.  No lower extremity swelling or edema, no short of breath to indicate PE or emboli.  Patient with likely vasovagal episode given her worsening migraine.  Patient without shortness of breath or chest pain, she will be referred to neurology for ongoing migraines, encouraged to return immediately if she develops any new or worsening signs or symptoms.  Patient verbalized understanding and agreement to today's plan and had discharge.     ED Discharge Orders    None       Rosalio LoudHedges, Maigan Bittinger, PA-C 02/13/17 2146    Wynetta FinesMessick, Peter C, MD 02/19/17 1600

## 2017-02-13 NOTE — ED Notes (Signed)
Pt states that she has a pressure pain on the left side of her head when standing.

## 2017-02-13 NOTE — Discharge Instructions (Signed)
Please read attached information. If you experience any new or worsening signs or symptoms please return to the emergency room for evaluation. Please follow-up with your primary care provider or specialist as discussed. Please use medication prescribed only as directed and discontinue taking if you have any concerning signs or symptoms.   °

## 2017-02-14 ENCOUNTER — Ambulatory Visit: Payer: BLUE CROSS/BLUE SHIELD | Admitting: Pediatrics

## 2017-02-14 ENCOUNTER — Encounter: Payer: Self-pay | Admitting: Neurology

## 2017-02-14 ENCOUNTER — Ambulatory Visit: Payer: BLUE CROSS/BLUE SHIELD | Admitting: Neurology

## 2017-02-14 VITALS — BP 102/65 | HR 72 | Wt 131.6 lb

## 2017-02-14 DIAGNOSIS — G43909 Migraine, unspecified, not intractable, without status migrainosus: Secondary | ICD-10-CM | POA: Diagnosis not present

## 2017-02-14 DIAGNOSIS — R419 Unspecified symptoms and signs involving cognitive functions and awareness: Secondary | ICD-10-CM | POA: Diagnosis not present

## 2017-02-14 DIAGNOSIS — R5382 Chronic fatigue, unspecified: Secondary | ICD-10-CM

## 2017-02-14 MED ORDER — RIZATRIPTAN BENZOATE 10 MG PO TABS: 10.0000 mg | ORAL_TABLET | ORAL | 11 refills | Status: DC | PRN

## 2017-02-14 MED ORDER — METHYLPREDNISOLONE 4 MG PO TBPK: ORAL_TABLET | ORAL | 1 refills | Status: DC

## 2017-02-14 MED ORDER — KETOROLAC TROMETHAMINE 60 MG/2ML IM SOLN
60.0000 mg | Freq: Once | INTRAMUSCULAR | Status: AC
  Administered 2017-02-14: 60 mg via INTRAMUSCULAR

## 2017-02-14 MED ORDER — ONDANSETRON HCL 4 MG PO TABS: 4.0000 mg | ORAL_TABLET | Freq: Three times a day (TID) | ORAL | 11 refills | Status: DC | PRN

## 2017-02-14 NOTE — Progress Notes (Signed)
Toradol 60 mg injection given IM under aseptic technique. Tolerated well. Bandaid applied.

## 2017-02-14 NOTE — Progress Notes (Signed)
[facility]    Provider:  Dr Lucia GaskinsAhern Referring Provider: Johna SheriffVincent, Carol L, MD Primary Care Physician:  Johna SheriffVincent, Carol L, MD  CC:  Migraines  HPI:  Courtney Bush is a 21 y.o. female here as a referral from Dr. Oswaldo DoneVincent for migraines. Yesterday she had a severe migraine, on the right of head, above the eye, she got hot and she had a syncopal event. She tensed up and was shaking and "went out", no post-ictal confusion. No urination or tongue biting. She threw up twice yesterday. Headache is still lingering. Migraines are unilateral above the eyes, pulsating and throbbing. More on the left. +nausea, vomiting, light and sound sensitivity. She has a postdrome of fatigue. Started at age 89. FHx of migraines of paternal. Mother is here and provides much information. Vomiting helps. Unknown triggers. At least 4 migraine days a month. She can have other dull headaches. She is fatigued. Otherwise she feels her migraines are treated effectively by current regimen. No other focal neurologic deficits, associated symptoms, inciting events or modifiable factors.   Reviewed notes, labs and imaging from outside physicians, which showed:    CT of the head 02/13/2017  showed No acute intracranial abnormalities including mass lesion or mass effect, hydrocephalus, extra-axial fluid collection, midline shift, hemorrhage, or acute infarction, large ischemic events (personally reviewed images)  CBC, CMP normal   Reviewed emergency room notes she presented with migraine yesterday.  She has a significant past medical history of migraine.  She had a migraine upon awakening, Advil did not improve her symptoms headache became more severe and she had a syncopal episode her significant other reportedly she will car for approximately 10 seconds throughout the episode, no postictal phase, no history of seizure, she still has an ongoing left-sided migraine, no neurologic deficits, no fever neck pain or any other  red flags.  She was treated with Reglan, Toradol, and Benadryl.  She was well-appearing, no neurologic deficits on exam, migraines were similar to previous, labs, orthostatic vital signs, and head CT were all unremarkable, low suspicion for seizure likely vasovagal due to pain.  Review of Systems: Patient complains of symptoms per HPI as well as the following symptoms: blurred vision, headache. Pertinent negatives and positives per HPI. All others negative.   Social History   Socioeconomic History  . Marital status: Single    Spouse name: Not on file  . Number of children: Not on file  . Years of education: Not on file  . Highest education level: Not on file  Social Needs  . Financial resource strain: Not on file  . Food insecurity - worry: Not on file  . Food insecurity - inability: Not on file  . Transportation needs - medical: Not on file  . Transportation needs - non-medical: Not on file  Occupational History  . Not on file  Tobacco Use  . Smoking status: Never Smoker  . Smokeless tobacco: Never Used  Substance and Sexual Activity  . Alcohol use: No    Alcohol/week: 0.0 oz  . Drug use: No  . Sexual activity: Not on file  Other Topics Concern  . Not on file  Social History Narrative  . Not on file    Family History  Problem Relation Age of Onset  . Hypertension Father   . Hyperlipidemia Father   . Seizures Neg Hx     Past Medical History:  Diagnosis Date  . Allergy    factor fived deficiency  . Asthma   .  Factor V deficiency (HCC)   . Migraine     Past Surgical History:  Procedure Laterality Date  . TONSILLECTOMY      Current Outpatient Medications  Medication Sig Dispense Refill  . albuterol (PROVENTIL HFA;VENTOLIN HFA) 108 (90 BASE) MCG/ACT inhaler Inhale 2 puffs into the lungs every 6 (six) hours as needed for wheezing. 1 Inhaler 2  . EPINEPHrine (EPIPEN) 0.3 mg/0.3 mL SOAJ Inject 0.3 mLs (0.3 mg total) into the muscle once. 1 Device 1  . fluticasone  (FLONASE) 50 MCG/ACT nasal spray Place 2 sprays into both nostrils daily. 16 g 6  . ibuprofen (ADVIL,MOTRIN) 200 MG tablet Take 200 mg by mouth every 6 (six) hours as needed for moderate pain.    . rizatriptan (MAXALT) 10 MG tablet Take 1 tablet (10 mg total) by mouth as needed for migraine. May repeat in 2 hours if needed 10 tablet 11  . methylPREDNISolone (MEDROL DOSEPAK) 4 MG TBPK tablet follow package directions 21 tablet 1  . ondansetron (ZOFRAN) 4 MG tablet Take 1 tablet (4 mg total) by mouth every 8 (eight) hours as needed for nausea or vomiting. Also may take for migraine. 20 tablet 11   No current facility-administered medications for this visit.     Allergies as of 02/14/2017 - Review Complete 02/14/2017  Allergen Reaction Noted  . Alpha-gal Anaphylaxis 01/17/2017  . Other Hives and Rash 11/21/2013    Vitals: BP 102/65   Pulse 72   Wt 131 lb 9.6 oz (59.7 kg)   BMI 20.61 kg/m  Last Weight:  Wt Readings from Last 1 Encounters:  02/14/17 131 lb 9.6 oz (59.7 kg)   Last Height:   Ht Readings from Last 1 Encounters:  01/17/17 5\' 7"  (1.702 m)   Physical exam: Exam: Gen: NAD, conversant, well nourised, well groomed                     CV: RRR, no MRG. No Carotid Bruits. No peripheral edema, warm, nontender Eyes: Conjunctivae clear without exudates or hemorrhage  Neuro: Detailed Neurologic Exam  Speech:    Speech is normal; fluent and spontaneous with normal comprehension.  Cognition:    The patient is oriented to person, place, and time;     recent and remote memory intact;     language fluent;     normal attention, concentration,     fund of knowledge Cranial Nerves:    The pupils are equal, round, and reactive to light. The fundi are normal and spontaneous venous pulsations are present. Visual fields are full to finger confrontation. Extraocular movements are intact. Trigeminal sensation is intact and the muscles of mastication are normal. The face is symmetric. The  palate elevates in the midline. Hearing intact. Voice is normal. Shoulder shrug is normal. The tongue has normal motion without fasciculations.   Coordination:    Normal finger to nose and heel to shin. Normal rapid alternating movements.   Gait:    Heel-toe and tandem gait are normal.   Motor Observation:    No asymmetry, no atrophy, and no involuntary movements noted. Tone:    Normal muscle tone.    Posture:    Posture is normal. normal erect    Strength:    Strength is V/V in the upper and lower limbs.      Sensation: intact to LT     Reflex Exam:  DTR's:    Deep tendon reflexes in the upper and lower extremities are normal bilaterally.  Toes:    The toes are downgoing bilaterally.   Clonus:    Clonus is absent.       Assessment/Plan:  [age] year old with migraines and a syncopal episode with shaking. Most likely vasovagal syncope however need a seizure evaluation.  MRI of the brain w/wo contrast, seizure protocol EEG to eval for epileptiform activity  Patient is unable to drive, operate heavy machinery, perform activities at heights or participate in water activities until 6 months seizure free  Seizure precautions, discussed  TSH for fatigue: At onset of migraine take Rizatriptan, may combine ondansetron and tylenol/ibuprofen   Migraine:   Orders Placed This Encounter  Procedures  . MR BRAIN W WO CONTRAST  . Thyroid Panel With TSH  . EEG   Discussed: To prevent or relieve headaches, try the following: Cool Compress. Lie down and place a cool compress on your head.  Avoid headache triggers. If certain foods or odors seem to have triggered your migraines in the past, avoid them. A headache diary might help you identify triggers.  Include physical activity in your daily routine. Try a daily walk or other moderate aerobic exercise.  Manage stress. Find healthy ways to cope with the stressors, such as delegating tasks on your to-do list.  Practice relaxation  techniques. Try deep breathing, yoga, massage and visualization.  Eat regularly. Eating regularly scheduled meals and maintaining a healthy diet might help prevent headaches. Also, drink plenty of fluids.  Follow a regular sleep schedule. Sleep deprivation might contribute to headaches Consider biofeedback. With this mind-body technique, you learn to control certain bodily functions - such as muscle tension, heart rate and blood pressure - to prevent headaches or reduce headache pain.    Proceed to emergency room if you experience new or worsening symptoms or symptoms do not resolve, if you have new neurologic symptoms or if headache is severe, or for any concerning symptom.   Provided education and documentation from American headache Society toolbox including articles on: chronic migraine medication overuse headache, chronic migraines, prevention of migraines, behavioral and other nonpharmacologic treatments for headache.   Courtney DeanAntonia Harley Mccartney, MD  [facility] [address] [place], [place] [id number]  Phone [phone] Fax [phone]

## 2017-02-14 NOTE — Patient Instructions (Addendum)
MRI of the brain Lab At onset of migraine take Rizatriptan, may combine ondansetron and tylenol/ibuprofen  EEG 6 days of steroids   Seizure, Adult A seizure is a sudden burst of abnormal electrical activity in the brain. The abnormal activity temporarily interrupts normal brain function, causing a person to experience any of the following:  Involuntary movements.  Changes in awareness or consciousness.  Uncontrollable shaking (convulsions).  Seizures usually last from 30 seconds to 2 minutes. They usually do not cause permanent brain damage unless they are prolonged. What can cause a seizure to happen? Seizures can happen for many reasons including:  A fever.  Low blood sugar.  A medicine.  An illnesses.  A brain injury.  Some people who have a seizure never have another one. People who have repeated seizures have a condition called epilepsy. What are the symptoms of a seizure? Symptoms of a seizure vary greatly from person to person. They include:  Convulsions.  Stiffening of the body.  Involuntary movements of the arms or legs.  Loss of consciousness.  Breathing problems.  Falling suddenly.  Confusion.  Head nodding.  Eye blinking or fluttering.  Lip smacking.  Drooling.  Rapid eye movements.  Grunting.  Loss of bladder control and bowel control.  Staring.  Unresponsiveness.  Some people have symptoms right before a seizure happens (aura) and right after a seizure happens. Symptoms of an aura include:  Fear or anxiety.  Nausea.  Feeling like the room is spinning (vertigo).  A feeling of having seen or heard something before (deja vu).  Odd tastes or smells.  Changes in vision, such as seeing flashing lights or spots.  Symptoms that may follow a seizure include:  Confusion.  Sleepiness.  Headache.  Weakness of one side of the body.  Follow these instructions at home: Medicines   Take over-the-counter and prescription  medicines only as told by your health care provider.  Avoid any substances that may prevent your medicine from working properly, such as alcohol. Activity  Do not drive, swim, or do any other activities that would be dangerous if you had another seizure. Wait until your health care provider approves.  If you live in the [place] check with your local DMV (department of motor vehicles) to find out about the local driving laws. Each state has specific rules about when you can legally return to driving.  Get enough rest. Lack of sleep can make seizures more likely to occur. Educating others Teach friends and family what to do if you have a seizure. They should:  Lay you on the ground to prevent a fall.  Cushion your head and body.  Loosen any tight clothing around your neck.  Turn you on your side. If vomiting occurs, this helps keep your airway clear.  Stay with you until you recover.  Not hold you down. Holding you down will not stop the seizure.  Not put anything in your mouth.  Know whether or not you need emergency care.  General instructions  Contact your health care provider each time you have a seizure.  Avoid anything that has ever triggered a seizure for you.  Keep a seizure diary. Record what you remember about each seizure, especially anything that might have triggered the seizure.  Keep all follow-up visits as told by your health care provider. This is important. Contact a health care provider if:  You have another seizure.  You have seizures more often.  Your seizure symptoms change.  You continue to  have seizures with treatment.  You have symptoms of an infection or illness. They might increase your risk of having a seizure. Get help right away if:  You have a seizure: ? That lasts longer than 5 minutes. ? That is different than previous seizures. ? That leaves you unable to speak or use a part of your body. ? That makes it harder to breathe. ? After a  head injury.  You have: ? Multiple seizures in a row. ? Confusion or a severe headache right after a seizure.  You are having seizures more often.  You do not wake up immediately after a seizure.  You injure yourself during a seizure. These symptoms may represent a serious problem that is an emergency. Do not wait to see if the symptoms will go away. Get medical help right away. Call your local emergency services (911 in the [place] Do not drive yourself to the hospital. This information is not intended to replace advice given to you by your health care provider. Make sure you discuss any questions you have with your health care provider. Document Released: 02/25/2000 Document Revised: 10/24/2015 Document Reviewed: 10/01/2015 Elsevier Interactive Patient Education  2017 [employer]

## 2017-02-15 ENCOUNTER — Telehealth: Payer: Self-pay | Admitting: *Deleted

## 2017-02-15 ENCOUNTER — Encounter: Payer: Self-pay | Admitting: Neurology

## 2017-02-15 LAB — THYROID PANEL WITH TSH
Free Thyroxine Index: 2.4 (ref 1.2–4.9)
T3 Uptake Ratio: 28 % (ref 24–39)
T4, Total: 8.7 ug/dL (ref 4.5–12.0)
TSH: 1 u[IU]/mL (ref 0.450–4.500)

## 2017-02-15 NOTE — Telephone Encounter (Signed)
-----   Message from Anson FretAntonia B Ahern, MD sent at 02/15/2017  8:29 AM EST ----- Lab normal

## 2017-02-15 NOTE — Telephone Encounter (Signed)
Called and LVM (ok per DPR- pending scan) informing patient that labs are normal. Left office number in case the patient has any questions.

## 2017-02-20 ENCOUNTER — Other Ambulatory Visit: Payer: BLUE CROSS/BLUE SHIELD

## 2017-02-21 ENCOUNTER — Other Ambulatory Visit: Payer: BLUE CROSS/BLUE SHIELD

## 2017-02-24 ENCOUNTER — Encounter: Payer: Self-pay | Admitting: Pediatrics

## 2017-03-01 ENCOUNTER — Ambulatory Visit: Payer: BLUE CROSS/BLUE SHIELD | Admitting: Neurology

## 2017-04-09 ENCOUNTER — Encounter (HOSPITAL_BASED_OUTPATIENT_CLINIC_OR_DEPARTMENT_OTHER): Payer: Self-pay

## 2017-04-09 ENCOUNTER — Other Ambulatory Visit (HOSPITAL_BASED_OUTPATIENT_CLINIC_OR_DEPARTMENT_OTHER): Payer: Self-pay | Admitting: Family Medicine

## 2017-04-09 ENCOUNTER — Ambulatory Visit (HOSPITAL_BASED_OUTPATIENT_CLINIC_OR_DEPARTMENT_OTHER): Payer: Self-pay

## 2017-04-09 DIAGNOSIS — R1031 Right lower quadrant pain: Secondary | ICD-10-CM

## 2017-04-09 DIAGNOSIS — R10821 Right upper quadrant rebound abdominal tenderness: Secondary | ICD-10-CM

## 2017-04-11 ENCOUNTER — Ambulatory Visit: Payer: BLUE CROSS/BLUE SHIELD | Admitting: Neurology

## 2017-04-11 ENCOUNTER — Telehealth: Payer: Self-pay | Admitting: *Deleted

## 2017-04-11 NOTE — Telephone Encounter (Signed)
Pt canceled appt same day 04/11/2017 d/t job conflict (per [place]).

## 2017-04-12 ENCOUNTER — Ambulatory Visit (HOSPITAL_BASED_OUTPATIENT_CLINIC_OR_DEPARTMENT_OTHER)
Admission: RE | Admit: 2017-04-12 | Discharge: 2017-04-12 | Disposition: A | Discharge status: Home / Self Care | Payer: BLUE CROSS/BLUE SHIELD | Source: Ambulatory Visit | Attending: Family Medicine | Admitting: Family Medicine

## 2017-04-12 DIAGNOSIS — K828 Other specified diseases of gallbladder: Secondary | ICD-10-CM | POA: Insufficient documentation

## 2017-04-12 DIAGNOSIS — R10811 Right upper quadrant abdominal tenderness: Secondary | ICD-10-CM | POA: Insufficient documentation

## 2017-04-12 DIAGNOSIS — R10821 Right upper quadrant rebound abdominal tenderness: Secondary | ICD-10-CM

## 2017-04-12 DIAGNOSIS — K824 Cholesterolosis of gallbladder: Secondary | ICD-10-CM | POA: Diagnosis not present

## 2017-08-01 ENCOUNTER — Other Ambulatory Visit: Payer: Self-pay | Admitting: *Deleted

## 2017-08-01 DIAGNOSIS — I83891 Varicose veins of right lower extremities with other complications: Secondary | ICD-10-CM

## 2017-08-13 ENCOUNTER — Telehealth: Payer: Self-pay | Admitting: Vascular Surgery

## 2017-08-13 NOTE — Telephone Encounter (Signed)
Sched appt 09/07/17; lab at 2:30 and CSD 3:30. Lm on cell# to inform pt of appt.

## 2017-08-13 NOTE — Telephone Encounter (Signed)
-----   Message from Micah FlesherSonya D Rankin, RN sent at 08/07/2017  8:56 AM EDT ----- Regarding: RE: scheduling Contact: [phone] Guess schedule her with Dr. Edilia Boickson or Dr. Darrick PennaFields.  Thanks!!!!  Lamar LaundrySonya   ----- Message ----- From: Jena Gaussoczniak, Michele A Sent: 08/02/2017  11:10 AM To: Micah FlesherSonya D Rankin, RN Subject: RE: scheduling                                 There isn't anything with JDL before he retires. Can she see anyone else?  ----- Message ----- From: Micah Flesherankin, Sonya D, RN Sent: 08/01/2017   2:45 PM To: Jena GaussMichele A Roczniak Subject: scheduling                                     Please schedule Jerline Painaylor White for venous reflux study (right leg, order in Epic) and VV FU with Dr. Hart RochesterLawson.  She is c/o right leg pain and swelling.  Last seen 10-2016 by Dr. Hart RochesterLawson.

## 2017-09-07 ENCOUNTER — Inpatient Hospital Stay (HOSPITAL_COMMUNITY): Admission: RE | Admit: 2017-09-07 | Payer: BLUE CROSS/BLUE SHIELD | Source: Ambulatory Visit

## 2017-09-07 ENCOUNTER — Encounter: Payer: BLUE CROSS/BLUE SHIELD | Admitting: Vascular Surgery

## 2017-09-11 ENCOUNTER — Other Ambulatory Visit: Payer: Self-pay

## 2017-09-11 ENCOUNTER — Emergency Department (HOSPITAL_COMMUNITY)
Admission: EM | Admit: 2017-09-11 | Discharge: 2017-09-11 | Disposition: A | Discharge status: Home / Self Care | Payer: BLUE CROSS/BLUE SHIELD | Attending: Emergency Medicine | Admitting: Emergency Medicine

## 2017-09-11 ENCOUNTER — Emergency Department (HOSPITAL_COMMUNITY): Payer: BLUE CROSS/BLUE SHIELD

## 2017-09-11 ENCOUNTER — Encounter (HOSPITAL_COMMUNITY): Payer: Self-pay | Admitting: Emergency Medicine

## 2017-09-11 DIAGNOSIS — J45909 Unspecified asthma, uncomplicated: Secondary | ICD-10-CM | POA: Diagnosis not present

## 2017-09-11 DIAGNOSIS — R197 Diarrhea, unspecified: Secondary | ICD-10-CM | POA: Insufficient documentation

## 2017-09-11 DIAGNOSIS — K828 Other specified diseases of gallbladder: Secondary | ICD-10-CM | POA: Diagnosis not present

## 2017-09-11 DIAGNOSIS — R110 Nausea: Secondary | ICD-10-CM | POA: Diagnosis not present

## 2017-09-11 DIAGNOSIS — K829 Disease of gallbladder, unspecified: Secondary | ICD-10-CM

## 2017-09-11 DIAGNOSIS — R112 Nausea with vomiting, unspecified: Secondary | ICD-10-CM | POA: Diagnosis not present

## 2017-09-11 DIAGNOSIS — R1011 Right upper quadrant pain: Secondary | ICD-10-CM | POA: Diagnosis not present

## 2017-09-11 LAB — CBC WITH DIFFERENTIAL/PLATELET
Basophils Absolute: 0 10*3/uL (ref 0.0–0.1)
Basophils Relative: 0 %
Eosinophils Absolute: 0.4 10*3/uL (ref 0.0–0.7)
Eosinophils Relative: 7 %
HCT: 43.8 % (ref 36.0–46.0)
Hemoglobin: 14.5 g/dL (ref 12.0–15.0)
Lymphocytes Relative: 45 %
Lymphs Abs: 2.2 10*3/uL (ref 0.7–4.0)
MCH: 31 pg (ref 26.0–34.0)
MCHC: 33.1 g/dL (ref 30.0–36.0)
MCV: 93.8 fL (ref 78.0–100.0)
Monocytes Absolute: 0.4 10*3/uL (ref 0.1–1.0)
Monocytes Relative: 8 %
Neutro Abs: 2 10*3/uL (ref 1.7–7.7)
Neutrophils Relative %: 40 %
Platelets: 260 10*3/uL (ref 150–400)
RBC: 4.67 MIL/uL (ref 3.87–5.11)
RDW: 11.7 % (ref 11.5–15.5)
WBC: 4.9 10*3/uL (ref 4.0–10.5)

## 2017-09-11 LAB — COMPREHENSIVE METABOLIC PANEL
ALT: 16 U/L (ref 0–44)
AST: 20 U/L (ref 15–41)
Albumin: 4.6 g/dL (ref 3.5–5.0)
Alkaline Phosphatase: 40 U/L (ref 38–126)
Anion gap: 6 (ref 5–15)
BUN: 9 mg/dL (ref 6–20)
CO2: 28 mmol/L (ref 22–32)
Calcium: 9.5 mg/dL (ref 8.9–10.3)
Chloride: 108 mmol/L (ref 98–111)
Creatinine, Ser: 0.8 mg/dL (ref 0.44–1.00)
GFR calc Af Amer: >60 mL/min (ref >60)
GFR calc non Af Amer: >60 mL/min (ref >60)
Glucose, Bld: 88 mg/dL (ref 70–99)
Potassium: 4.6 mmol/L (ref 3.5–5.1)
Sodium: 142 mmol/L (ref 135–145)
Total Bilirubin: 0.8 mg/dL (ref 0.3–1.2)
Total Protein: 7.6 g/dL (ref 6.5–8.1)

## 2017-09-11 MED ORDER — SODIUM CHLORIDE 0.9 % IV BOLUS
500.0000 mL | Freq: Once | INTRAVENOUS | Status: AC
  Administered 2017-09-11: 500 mL via INTRAVENOUS

## 2017-09-11 MED ORDER — TECHNETIUM TC 99M MEBROFENIN IV KIT
5.0000 | PACK | Freq: Once | INTRAVENOUS | Status: AC | PRN
  Administered 2017-09-11: 5.5 via INTRAVENOUS

## 2017-09-11 MED ORDER — SODIUM CHLORIDE 0.9 % IV SOLN
INTRAVENOUS | Status: DC
  Administered 2017-09-11: 10:00:00 via INTRAVENOUS

## 2017-09-11 MED ORDER — ONDANSETRON HCL 4 MG/2ML IJ SOLN
4.0000 mg | Freq: Once | INTRAMUSCULAR | Status: AC
  Administered 2017-09-11: 4 mg via INTRAVENOUS
  Filled 2017-09-11: qty 2

## 2017-09-11 MED ORDER — TRAMADOL HCL 50 MG PO TABS: 50.0000 mg | ORAL_TABLET | Freq: Four times a day (QID) | ORAL | 0 refills | Status: DC | PRN

## 2017-09-11 MED ORDER — ONDANSETRON HCL 8 MG PO TABS: 8.0000 mg | ORAL_TABLET | Freq: Three times a day (TID) | ORAL | 0 refills | Status: DC | PRN

## 2017-09-11 NOTE — ED Notes (Signed)
Pt back from [facility]

## 2017-09-11 NOTE — ED Triage Notes (Signed)
Pt c/o ruq abd pain, n/v/d since yesterday. Hx of "gallbaldder sludge". Nad.

## 2017-09-11 NOTE — Discharge Instructions (Addendum)
Stay on a low-fat diet, avoiding any fried foods, greasy foods or fatty foods.  Try taking Tylenol for pain.  Do not drive when taking the narcotic pain medicine, tramadol.

## 2017-09-11 NOTE — ED Provider Notes (Signed)
[facility] Provider Note   CSN: [id number] Arrival date & time: 09/11/17  0848     History   Chief Complaint Chief Complaint  Patient presents with  . Abdominal Pain  . Emesis    HPI Courtney Bush is a 22 y.o. female.  HPI   Resents for evaluation of nausea, vomiting and diarrhea all which started about 2 AM this morning.  Was concerned that it caused by her gallbladder so came here for evaluation.  There was no blood in vomit or stool.  She denies fever, chills, cough, shortness of breath, chest pain, weakness or dizziness.  She has never seen a surgeon regarding gallbladder disease.  She was diagnosed with "gallbladder sludge", several months ago.  There are no other no modifying factors.  Past Medical History:  Diagnosis Date  . Allergy    factor fived deficiency  . Asthma   . Factor V deficiency ([place])   . Migraine     Patient Active Problem List   Diagnosis Date Noted  . Factor V Leiden ([place]) 01/17/2017  . Allergy to alpha-gal 01/17/2017  . Varicose veins of right lower extremity with complications 40/98/1191  . Varicose veins of right lower extremity 12/28/2014  . Migraine 12/03/2014  . Tree nut allergy 12/03/2014  . Allergic rhinitis 12/03/2014  . Varicose veins of leg with complications 47/82/9562    Past Surgical History:  Procedure Laterality Date  . TONSILLECTOMY       OB History   None      Home Medications    Prior to Admission medications   Medication Sig Start Date End Date Taking? Authorizing Provider  EPINEPHrine (EPIPEN) 0.3 mg/0.3 mL SOAJ Inject 0.3 mLs (0.3 mg total) into the muscle once. 10/04/12  Yes Deneise Lever, MD  ibuprofen (ADVIL,MOTRIN) 200 MG tablet Take 200 mg by mouth every 6 (six) hours as needed for moderate pain.   Yes [provider]  ondansetron (ZOFRAN) 8 MG tablet Take 1 tablet (8 mg total) by mouth every 8 (eight) hours as needed for nausea or vomiting. 09/11/17   Daleen Bo, MD    traMADol (ULTRAM) 50 MG tablet Take 1 tablet (50 mg total) by mouth every 6 (six) hours as needed for moderate pain. 09/11/17   Daleen Bo, MD    Family History Family History  Problem Relation Age of Onset  . Hypertension Father   . Hyperlipidemia Father   . Seizures Neg Hx     Social History Social History   Tobacco Use  . Smoking status: Never Smoker  . Smokeless tobacco: Never Used  Substance Use Topics  . Alcohol use: No    Alcohol/week: 0.0 oz  . Drug use: No     Allergies   Alpha-gal and Other   Review of Systems Review of Systems  All other systems reviewed and are negative.    Physical Exam Updated Vital Signs BP 114/73 (BP Location: Right Arm)   Pulse 71   Temp 97.9 F (36.6 C) (Oral)   Resp 14   LMP 09/11/2017 (Exact Date)   SpO2 100%   Physical Exam  Constitutional: She is oriented to person, place, and time. She appears well-developed and well-nourished.  HENT:  Head: Normocephalic and atraumatic.  Eyes: Pupils are equal, round, and reactive to light. Conjunctivae and EOM are normal.  Neck: Normal range of motion and phonation normal. Neck supple.  Cardiovascular: Normal rate and regular rhythm.  Pulmonary/Chest: Effort normal and breath sounds  normal. She exhibits no tenderness.  Abdominal: Soft. She exhibits no distension. There is tenderness (Mild) in the right upper quadrant. There is no rigidity, no rebound, no guarding and no CVA tenderness. No hernia.  Musculoskeletal: Normal range of motion.  Neurological: She is alert and oriented to person, place, and time. She exhibits normal muscle tone.  Skin: Skin is warm and dry.  Psychiatric: She has a normal mood and affect. Her behavior is normal. Judgment and thought content normal.  Nursing note and vitals reviewed.    ED Treatments / Results  Labs (all labs ordered are listed, but only abnormal results are displayed) Labs Reviewed  COMPREHENSIVE METABOLIC PANEL  CBC WITH  DIFFERENTIAL/PLATELET    EKG None  Radiology Nm Hepato W/eject Fract  Result Date: 09/11/2017 CLINICAL DATA:  22 year old female with right upper quadrant abdominal pain for the past 3 months worsening over time. Recent attack 09/10/2017 with nausea. Symptoms worsened with food. Initial encounter. EXAM: NUCLEAR MEDICINE HEPATOBILIARY IMAGING WITH GALLBLADDER EF TECHNIQUE: Sequential images of the abdomen were obtained out to 60 minutes following intravenous administration of radiopharmaceutical. After oral ingestion of Ensure, gallbladder ejection fraction was determined. At 60 min, normal ejection fraction is greater than 33%. RADIOPHARMACEUTICALS:  5.5 mCi Tc-63m Choletec IV COMPARISON:  04/12/2017 ultrasound. FINDINGS: Prompt uptake and biliary excretion of activity by the liver is seen. Gallbladder activity is visualized, consistent with patency of cystic duct. Biliary activity passes into small bowel, consistent with patent common bile duct. Calculated gallbladder ejection fraction is 59%%. (Normal gallbladder ejection fraction with Ensure is greater than 33%.) Patient with baseline abdominal pain with additional nausea with ingestion of Ensure. IMPRESSION: Normal gallbladder ejection fraction. Electronically Signed   By: SGenia DelM.D.   On: 09/11/2017 16:06    Procedures Procedures (including critical care time)  Medications Ordered in ED Medications  0.9 %  sodium chloride infusion ( Intravenous New Bag/Given 09/11/17 1013)  sodium chloride 0.9 % bolus 500 mL (0 mLs Intravenous Stopped 09/11/17 1036)  ondansetron (ZOFRAN) injection 4 mg (4 mg Intravenous Given 09/11/17 0935)  technetium TC 47M mebrofenin (CHOLETEC) injection 5 millicurie (5.5 millicuries Intravenous Contrast Given 09/11/17 1340)     Initial Impression / Assessment and Plan / ED Course  I have reviewed the triage vital signs and the nursing notes.  Pertinent labs & imaging results that were available during my care of  the patient were reviewed by me and considered in my medical decision making (see chart for details).  Clinical Course as of Sep 11 1628  Tue Sep 11, 2017  1620 Normal  NM Hepato W/EjeCT Fract [EW]    Clinical Course User Index [EW] WDaleen Bo MD     Patient Vitals for the past 24 hrs:  BP Temp Temp src Pulse Resp SpO2  09/11/17 1625 114/73 - - 71 14 100 %  09/11/17 1300 95/63 - - 67 - 100 %  09/11/17 1245 - - - (!) 55 - 100 %  09/11/17 1230 96/64 - - (!) 57 - 100 %  09/11/17 1215 - - - 64 - 100 %  09/11/17 1200 102/68 - - (!) 54 - 100 %  09/11/17 1135 108/75 - - (!) 57 18 100 %  09/11/17 1130 101/61 - - 77 - 100 %  09/11/17 1100 108/75 - - (!) 57 - 100 %  09/11/17 1000 101/72 - - 67 - 100 %  09/11/17 0930 108/72 - - 62 - 100 %  09/11/17 0900  103/68 - - 91 - 100 %  09/11/17 0859 118/79 97.9 F (36.6 C) Oral 67 20 100 %  09/11/17 0857 118/79 - - 71 - 100 %    4:21 PM Reevaluation with update and discussion. After initial assessment and treatment, an updated evaluation reveals she is fairly comfortable now.  Findings discussed and questions answered. Daleen Bo   Medical Decision Making: Right upper quadrant abdominal pain, recurrent, with known gallbladder sludge.  Functional testing done today shows normal functioning gallbladder.  CBC and c-Met are normal.  Vital signs normal.  Doubt cholecystitis, serious bacterial infection, metabolic instability or impending vascular collapse.  CRITICAL CARE-no Performed by: Daleen Bo   Nursing Notes Reviewed/ Care Coordinated Applicable Imaging Reviewed Interpretation of Laboratory Data incorporated into ED treatment  The patient appears reasonably screened and/or stabilized for discharge and I doubt any other medical condition or other [facility] requiring further screening, evaluation, or treatment in the ED at this time prior to discharge.  Plan: Home Medications-OTC as needed; Home Treatments-low-fat diet; return here if the  recommended treatment, does not improve the symptoms; Recommended follow up-General surgery follow-up for discussion of ongoing management of gallbladder disease.    Final Clinical Impressions(s) / ED Diagnoses   Final diagnoses:  Gallbladder disease    ED Discharge Orders        Ordered    traMADol (ULTRAM) 50 MG tablet  Every 6 hours PRN     09/11/17 1628    ondansetron (ZOFRAN) 8 MG tablet  Every 8 hours PRN     09/11/17 1628       Daleen Bo, MD 09/11/17 1631

## 2017-10-02 ENCOUNTER — Encounter: Payer: Self-pay | Admitting: General Surgery

## 2017-10-02 ENCOUNTER — Ambulatory Visit: Payer: BLUE CROSS/BLUE SHIELD | Admitting: General Surgery

## 2017-10-02 VITALS — BP 129/78 | HR 79 | Temp 97.8°F | Resp 16 | Wt 131.0 lb

## 2017-10-02 DIAGNOSIS — K828 Other specified diseases of gallbladder: Secondary | ICD-10-CM

## 2017-10-02 NOTE — Progress Notes (Signed)
[facility] History and Physical  Reason for Referral: Gallbladder/ RUQ pain  Referring Physician:  Dr. Vincent      Chief Complaint    Abdominal Pain      Courtney Bush is a 22 y.o. female.  HPI: Ms. Bush is a 22 yo with RUQ pain that has been persistent since January 2019. She reports that since that time she has had worsening RUQ pain that was initially worse with greasy foods and now has been more chronic and constant in nature. She underwent a RUQ US that did demonstrate sludge 03/2017 and then underwent a HIDA scan that was actually normal a few weeks ago.   She continues to have this RUQ pain and some nausea/bloating with some vomiting. She says that she can wake up in the morning and the pain is there and it is limiting her ability to go to work now. She also reports a history of some bleeding with wiping after having some diarrhea. She denies any hemorrhoid issues in the past and was not on her period.   She also has a history of a grandmother with Factor V Leiden who has issues with bleeding and blood clots, and her mother and her self have been tested per her report and were found to be heterozygous for the mutation. She says that she was found to have a clot in the past when she was undergoing some vein stripping. On my review of the chart, I cannot find documentation in the [facility] system of any DVT and most notes explicitly say no DVT. I did find some documentation of the saphenous vein being occluded during a laser ablation.  I am not sure if this is what she is speaking about.   She reports however that she took medication for a few months / blood thinners. She cannot recall the name of the blood thinner.  I cannot find in this system where she has taken any blood thinners specifically.   Past Medical History:  Diagnosis Date  . Allergy    factor fived deficiency  . Asthma   . Blood clot in vein    ? question of clot in vein per the patient,  no documentation to support DVT in [facility]  . Factor V deficiency ([facility])   . Factor V Leiden carrier ([facility])   . Migraine          Past Surgical History:  Procedure Laterality Date  . TONSILLECTOMY           Family History  Problem Relation Age of Onset  . Hypertension Father   . Hyperlipidemia Father   . Factor V Leiden deficiency Maternal Grandmother   . Clotting disorder Maternal Grandmother   . Seizures Neg Hx     Social History        Tobacco Use  . Smoking status: Never Smoker  . Smokeless tobacco: Never Used  Substance Use Topics  . Alcohol use: No    Alcohol/week: 0.0 oz  . Drug use: No    Medications: I have reviewed the patient's current medications.      Allergies as of 10/02/2017      Reactions   Alpha-gal Anaphylaxis   Other Hives, Rash   Tree nuts               Medication List            Accurate as of 10/02/17  1:23 PM. Always use your most recent med list.             EPINEPHrine 0.3 mg/0.3 mL Soaj injection Commonly known as:  EPIPEN Inject 0.3 mLs (0.3 mg total) into the muscle once.   ibuprofen 200 MG tablet Commonly known as:  ADVIL,MOTRIN Take 200 mg by mouth every 6 (six) hours as needed for moderate pain.   ondansetron 8 MG tablet Commonly known as:  ZOFRAN Take 1 tablet (8 mg total) by mouth every 8 (eight) hours as needed for nausea or vomiting.   traMADol 50 MG tablet Commonly known as:  ULTRAM Take 1 tablet (50 mg total) by mouth every 6 (six) hours as needed for moderate pain.        ROS:  A comprehensive review of systems was negative except for: Gastrointestinal: positive for abdominal pain, diarrhea, nausea, vomiting and bleeding with wiping BM Hematologic/lymphatic: positive for clotting problems/ blood clot Musculoskeletal: positive for back pain Neurological: positive for headaches Endocrine: positive for tired/ sluggish  Blood pressure 129/78, pulse 79, temperature  97.8 F (36.6 C), temperature source Temporal, resp. rate 16, weight 131 lb (59.4 kg), last menstrual period 09/11/2017. Physical Exam  Constitutional: She appears well-developed.  HENT:  Head: Normocephalic and atraumatic.  Eyes: Pupils are equal, round, and reactive to light. EOM are normal.  Cardiovascular: Normal rate and regular rhythm.  Pulmonary/Chest: Effort normal and breath sounds normal.  Abdominal: Soft. Normal appearance. She exhibits no distension. There is tenderness in the right upper quadrant. There is no rigidity and no guarding.  Genitourinary: Rectal exam shows tenderness. Rectal exam shows no external hemorrhoid, no mass and anal tone normal.  Genitourinary Comments: No gross blood on rectal exam  Vitals reviewed.   Results: Labs: Results for Bush, Courtney Bush (MRN [id number]) as of 10/02/2017 11:21  Ref. Range 09/11/2017 09:15  Sodium Latest Ref Range: 135 - 145 mmol/L 142  Potassium Latest Ref Range: 3.5 - 5.1 mmol/L 4.6  Chloride Latest Ref Range: 98 - 111 mmol/L 108  CO2 Latest Ref Range: 22 - 32 mmol/L 28  Glucose Latest Ref Range: 70 - 99 mg/dL 88  BUN Latest Ref Range: 6 - 20 mg/dL 9  Creatinine Latest Ref Range: 0.44 - 1.00 mg/dL 0.80  Calcium Latest Ref Range: 8.9 - 10.3 mg/dL 9.5  Anion gap Latest Ref Range: 5 - 15  6  Alkaline Phosphatase Latest Ref Range: 38 - 126 U/L 40  Albumin Latest Ref Range: 3.5 - 5.0 g/dL 4.6  AST Latest Ref Range: 15 - 41 U/L 20  ALT Latest Ref Range: 0 - 44 U/L 16  Total Protein Latest Ref Range: 6.5 - 8.1 g/dL 7.6  Total Bilirubin Latest Ref Range: 0.3 - 1.2 mg/dL 0.8  GFR, Est Non African American Latest Ref Range: >60 mL/min >60  GFR, Est African American Latest Ref Range: >60 mL/min >60  WBC Latest Ref Range: 4.0 - 10.5 K/uL 4.9  RBC Latest Ref Range: 3.87 - 5.11 MIL/uL 4.67  Hemoglobin Latest Ref Range: 12.0 - 15.0 g/dL 14.5  HCT Latest Ref Range: 36.0 - 46.0 % 43.8  MCV Latest Ref Range: 78.0 - 100.0 fL 93.8  MCH  Latest Ref Range: 26.0 - 34.0 pg 31.0  MCHC Latest Ref Range: 30.0 - 36.0 g/dL 33.1  RDW Latest Ref Range: 11.5 - 15.5 % 11.7  Platelets Latest Ref Range: 150 - 400 K/uL 260  Neutrophils Latest Units: % 40  Lymphocytes Latest Units: % 45  Monocytes Relative Latest Units: % 8  Eosinophil Latest Units: % 7  Basophil Latest Units: % 0  NEUT# Latest   Ref Range: 1.7 - 7.7 K/uL 2.0  Lymphocyte # Latest Ref Range: 0.7 - 4.0 K/uL 2.2  Monocyte # Latest Ref Range: 0.1 - 1.0 K/uL 0.4  Eosinophils Absolute Latest Ref Range: 0.0 - 0.7 K/uL 0.4  Basophils Absolute Latest Ref Range: 0.0 - 0.1 K/uL 0.0   US RUQ 03/2017 CLINICAL DATA: Epigastric and right upper quadrant pain  EXAM: ABDOMEN ULTRASOUND COMPLETE  COMPARISON: CT scan 11/21/2013  FINDINGS: Gallbladder: No gallstones or gallbladder wall thickening. No pericholecystic fluid. Sludge noted within the gallbladder lumen. The sonographer reports no sonographic Murphy's sign.  Common bile duct: Diameter: 1 mm  Liver: No focal lesion identified. Within normal limits in parenchymal echogenicity. Portal vein is patent on color Doppler imaging with normal direction of blood flow towards the liver.  IVC: No abnormality visualized.  Pancreas: Visualized portion unremarkable.  Spleen: Size and appearance within normal limits.  Right Kidney: Length: 12.2 cm. Echogenicity within normal limits. No mass or hydronephrosis visualized.  Left Kidney: Length: 12.1 cm. Echogenicity within normal limits. No mass or hydronephrosis visualized.  Abdominal aorta: No aneurysm visualized.  Other findings: None.  IMPRESSION: Sludge in the gallbladder, otherwise normal abdominal ultrasound Exam.  HIDA 09/2017 EXAM: NUCLEAR MEDICINE HEPATOBILIARY IMAGING WITH GALLBLADDER EF  TECHNIQUE: Sequential images of the abdomen were obtained out to 60 minutes following intravenous administration of radiopharmaceutical. After oral ingestion  of Ensure, gallbladder ejection fraction was determined. At 60 min, normal ejection fraction is greater than 33%.  RADIOPHARMACEUTICALS: 5.5 mCi Tc-99m Choletec IV  COMPARISON: 04/12/2017 ultrasound.  FINDINGS: Prompt uptake and biliary excretion of activity by the liver is seen. Gallbladder activity is visualized, consistent with patency of cystic duct. Biliary activity passes into small bowel, consistent with patent common bile duct.  Calculated gallbladder ejection fraction is 59%%. (Normal gallbladder ejection fraction with Ensure is greater than 33%.)  Patient with baseline abdominal pain with additional nausea with ingestion of Ensure.  IMPRESSION: Normal gallbladder ejection fraction.  Assessment & Plan:  Abbegayle Bush Bush is a 22 y.o. female with gallbladder sludge that is likely causing her problems with her RUQ pain, and a history of a Factor V Leiden heterozygous mutation per her report. The patient also reporting some history of a blood clot and history of using blood thinners, but I cannot find specific documentation of this in the chart. She also reports her blood being tested and her mother's blood, saying they both have 1 copy of the mutation.  This again is not in the [facility] system. Her grandmother is also reported to have had clots.   -OR for laparoscopic cholecystectomy possible open  -Given her factor V leiden heterozygosity and possible history of clot and family history of clot in her grandmother and also history of smoking, I have reviewed the literature and there is enough recommendations to support use of prophylaxis in this patient to prevent against the formation of blood clots in the perioperative period -Plan for lovenox 40mg injection the day of surgery and lovenox for 4 days following surgery, and precautions for the patient to be up and ambulating post operatively    PLAN: I counseled the patient about the indication, risks and benefits of  laparoscopic cholecystectomy.  She understands there is a very small chance for bleeding, infection, injury to normal structures (including common bile duct), conversion to open surgery, persistent symptoms, evolution of postcholecystectomy diarrhea, need for secondary interventions, anesthesia reaction, cardiopulmonary issues and other risks not specifically detailed here. I described the expected recovery,   the plan for follow-up and the restrictions during the recovery phase.  All questions were answered.  All questions were answered to the satisfaction of the patient and her fiance.   Elveria Lauderbaugh C Koal Eslinger 10/02/2017, 1:23 PM     

## 2017-10-02 NOTE — Patient Instructions (Addendum)
How and Where to Give Subcutaneous Enoxaparin Injections °Enoxaparin is an injectable medicine. It is used to help prevent blood clots from developing in your veins. Health care providers often use anticoagulants like enoxaparin to prevent clots following surgery. Enoxaparin is also used in combination with other medicines to treat blood clots and heart attacks. If blood clots are left untreated, they can be life threatening. °Enoxaparin comes in single-use syringes. You inject enoxaparin through a syringe into your belly (abdomen). You should change the injection site each time you give yourself a shot. Continue the enoxaparin injections as directed by your health care provider. Your health care provider will use blood clotting test results to decide when you can safely stop using enoxaparin injections. If your health care provider prescribes any additional medicines, use the medicines exactly as directed. °How do I inject enoxaparin? °1. Wash your hands with soap and water. °2. Clean the selected injection site as directed by your health care provider. °3. Remove the needle cap by pulling it straight off the syringe. °4. When using a prefilled syringe, do not push the air bubble out of the syringe before the injection. The air bubble will help you get all of the medicine out of the syringe. °5. Hold the syringe like a pencil using your writing hand. °6. Use your other hand to pinch and hold an inch of the cleansed skin. °7. Insert the entire needle straight down into the fold of skin. °8. Push the plunger with your thumb until the syringe is empty. °9. Pull the needle straight out of your skin. °10. Enoxaparin injection prefilled syringes and graduated prefilled syringes are available with a system that shields the needle after injection. After you have completed your injection and removed the needle from your skin, firmly push down on the plunger. The protective sleeve will automatically cover the needle and you  will hear a click. The click means the needle is safely covered. °11. Place the syringe in the nearest needle box, also called a sharps container. If you do not have a sharps container, you can use a hard-sided plastic container with a secure lid, such as an empty laundry detergent bottle. °What else do I need to know? °· Do not use enoxaparin if: °¨ You have allergies to heparin or pork products. °¨ You have been diagnosed with a condition called thrombocytopenia. °· Do not use the syringe or needle more than one time. °· Use medicines only as directed by your health care provider. °· Changes in medicines, supplements, diet, and illness can affect your anticoagulation therapy. Be sure to inform your health care provider of any of these changes. °· It is important that you tell all of your health care providers and your dentist that you are taking an anticoagulant, especially if you are injured or plan to have any type of procedure. °· While on anticoagulants, you will need to have blood tests done routinely as directed by your health care provider. °· While using this medicine, avoid physical activities or sports that could result in a fall or cause injury. °· Follow up with your laboratory test and health care provider appointments as directed. It is very important to keep your appointments. Not keeping appointments could result in a chronic or permanent injury, pain, or disability. °· Before giving your medicine, you should make sure the injection is a clear and colorless or pale yellow solution. If your medicine becomes discolored or if there are particles in the syringe, do not use   it and notify your health care provider.  Keep your medicine safely stored at room temperature. Contact a health care provider if:  You develop any rashes on your skin.  You have large areas of bruising on your skin.  You have any worsening of the condition for which you take Enoxaparin.  You develop a fever. Get help  right away if:  You develop bleeding problems such as: ? Bleeding from the gums or nose that does not stop quickly. ? Vomiting blood or coughing up blood. ? Blood in your urine. ? Blood in your stool, or stool that has a dark, tarry, or coffee grounds appearance. ? A cut that does not stop bleeding within 10 minutes. These symptoms may represent a serious problem that is an emergency. Do not wait to see if the symptoms will go away. Get medical help right away. Call your local emergency services (911 in the [place] Do not drive yourself to the hospital. This information is not intended to replace advice given to you by your health care provider. Make sure you discuss any questions you have with your health care provider. Document Released: 12/30/2003 Document Revised: 11/04/2015 Document Reviewed: 08/14/2013 Elsevier Interactive Patient Education  2018 [employer].  Enoxaparin injection What is this medicine? ENOXAPARIN (ee nox a PA rin) is used after knee, hip, or abdominal surgeries to prevent blood clotting. It is also used to treat existing blood clots in the lungs or in the veins. This medicine may be used for other purposes; ask your health care provider or pharmacist if you have questions. COMMON BRAND NAME(S): Lovenox What should I tell my health care provider before I take this medicine? They need to know if you have any of these conditions: -bleeding disorders, hemorrhage, or hemophilia -infection of the heart or heart valves -kidney or liver disease -previous stroke -prosthetic heart valve -recent surgery or delivery of a baby -ulcer in the stomach or intestine, diverticulitis, or other bowel disease -an unusual or allergic reaction to enoxaparin, heparin, pork or pork products, other medicines, foods, dyes, or preservatives -pregnant or trying to get pregnant -breast-feeding How should I use this medicine? This medicine is for injection under the skin. It is usually given  by a health-care professional. You or a family member may be trained on how to give the injections. If you are to give yourself injections, make sure you understand how to use the syringe, measure the dose if necessary, and give the injection. To avoid bruising, do not rub the site where this medicine has been injected. Do not take your medicine more often than directed. Do not stop taking except on the advice of your doctor or health care professional. Make sure you receive a puncture-resistant container to dispose of the needles and syringes once you have finished with them. Do not reuse these items. Return the container to your doctor or health care professional for proper disposal. Talk to your pediatrician regarding the use of this medicine in children. Special care may be needed. Overdosage: If you think you have taken too much of this medicine contact a poison control center or emergency room at once. NOTE: This medicine is only for you. Do not share this medicine with others. What if I miss a dose? If you miss a dose, take it as soon as you can. If it is almost time for your next dose, take only that dose. Do not take double or extra doses. What may interact with this medicine? -aspirin and  aspirin-like medicines -certain medicines that treat or prevent blood clots -dipyridamole -NSAIDs, medicines for pain and inflammation, like ibuprofen or naproxen This list may not describe all possible interactions. Give your health care provider a list of all the medicines, herbs, non-prescription drugs, or dietary supplements you use. Also tell them if you smoke, drink alcohol, or use illegal drugs. Some items may interact with your medicine. What should I watch for while using this medicine? Visit your doctor or health care professional for regular checks on your progress. Your condition will be monitored carefully while you are receiving this medicine. Notify your doctor or health care professional and  seek emergency treatment if you develop breathing problems; changes in vision; chest pain; severe, sudden headache; pain, swelling, warmth in the leg; trouble speaking; sudden numbness or weakness of the face, arm, or leg. These can be signs that your condition has gotten worse. If you are going to have surgery, tell your doctor or health care professional that you are taking this medicine. Do not stop taking this medicine without first talking to your doctor. Be sure to refill your prescription before you run out of medicine. Avoid sports and activities that might cause injury while you are using this medicine. Severe falls or injuries can cause unseen bleeding. Be careful when using sharp tools or knives. Consider using an Neurosurgeon. Take special care brushing or flossing your teeth. Report any injuries, bruising, or red spots on the skin to your doctor or health care professional. What side effects may I notice from receiving this medicine? Side effects that you should report to your doctor or health care professional as soon as possible: -allergic reactions like skin rash, itching or hives, swelling of the face, lips, or tongue -feeling faint or lightheaded, falls -signs and symptoms of bleeding such as bloody or black, tarry stools; red or dark-brown urine; spitting up blood or brown material that looks like coffee grounds; red spots on the skin; unusual bruising or bleeding from the eye, gums, or nose Side effects that usually do not require medical attention (report to your doctor or health care professional if they continue or are bothersome): -pain, redness, or irritation at site where injected This list may not describe all possible side effects. Call your doctor for medical advice about side effects. You may report side effects to FDA at 1-800-FDA-1088. Where should I keep my medicine? Keep out of the reach of children. Store at room temperature between 15 and 30 degrees C (59 and 86  degrees F). Do not freeze. If your injections have been specially prepared, you may need to store them in the refrigerator. Ask your pharmacist. Throw away any unused medicine after the expiration date. NOTE: This sheet is a summary. It may not cover all possible information. If you have questions about this medicine, talk to your doctor, pharmacist, or health care provider.  2018 Elsevier/[employer] Standard (2013-07-01 16:06:21)   Laparoscopic Cholecystectomy Laparoscopic cholecystectomy is surgery to remove the gallbladder. The gallbladder is a pear-shaped organ that lies beneath the liver on the right side of the body. The gallbladder stores bile, which is a fluid that helps the body to digest fats. Cholecystectomy is often done for inflammation of the gallbladder (cholecystitis). This condition is usually caused by a buildup of gallstones (cholelithiasis) in the gallbladder. Gallstones can block the flow of bile, which can result in inflammation and pain. In severe cases, emergency surgery may be required. This procedure is done though small incisions in your  abdomen (laparoscopic surgery). A thin scope with a camera (laparoscope) is inserted through one incision. Thin surgical instruments are inserted through the other incisions. In some cases, a laparoscopic procedure may be turned into a type of surgery that is done through a larger incision (open surgery). Tell a health care provider about:  Any allergies you have.  All medicines you are taking, including vitamins, herbs, eye drops, creams, and over-the-counter medicines.  Any problems you or family members have had with anesthetic medicines.  Any blood disorders you have.  Any surgeries you have had.  Any medical conditions you have.  Whether you are pregnant or may be pregnant. What are the risks? Generally, this is a safe procedure. However, problems may occur, including:  Infection.  Bleeding.  Allergic reactions to  medicines.  Damage to other structures or organs.  A stone remaining in the common bile duct. The common bile duct carries bile from the gallbladder into the small intestine.  A bile leak from the cyst duct that is clipped when your gallbladder is removed.  What happens before the procedure? Staying hydrated Follow instructions from your health care provider about hydration, which may include:  Up to 2 hours before the procedure - you may continue to drink clear liquids, such as water, clear fruit juice, black coffee, and plain tea.  Eating and drinking restrictions Follow instructions from your health care provider about eating and drinking, which may include:  8 hours before the procedure - stop eating heavy meals or foods such as meat, fried foods, or fatty foods.  6 hours before the procedure - stop eating light meals or foods, such as toast or cereal.  6 hours before the procedure - stop drinking milk or drinks that contain milk.  2 hours before the procedure - stop drinking clear liquids.  Medicines  Ask your health care provider about: ? Changing or stopping your regular medicines. This is especially important if you are taking diabetes medicines or blood thinners. ? Taking medicines such as aspirin and ibuprofen. These medicines can thin your blood. Do not take these medicines before your procedure if your health care provider instructs you not to.  You may be given antibiotic medicine to help prevent infection. General instructions  Let your health care provider know if you develop a cold or an infection before surgery.  Plan to have someone take you home from the hospital or clinic.  Ask your health care provider how your surgical site will be marked or identified. What happens during the procedure?  To reduce your risk of infection: ? Your health care team will wash or sanitize their hands. ? Your skin will be washed with soap. ? Hair may be removed from the  surgical area.  An IV tube may be inserted into one of your veins.  You will be given one or more of the following: ? A medicine to help you relax (sedative). ? A medicine to make you fall asleep (general anesthetic).  A breathing tube will be placed in your mouth.  Your surgeon will make several small cuts (incisions) in your abdomen.  The laparoscope will be inserted through one of the small incisions. The camera on the laparoscope will send images to a TV screen (monitor) in the operating room. This lets your surgeon see inside your abdomen.  Air-like gas will be pumped into your abdomen. This will expand your abdomen to give the surgeon more room to perform the surgery.  Other tools that  are needed for the procedure will be inserted through the other incisions. The gallbladder will be removed through one of the incisions.  Your common bile duct may be examined. If stones are found in the common bile duct, they may be removed.  After your gallbladder has been removed, the incisions will be closed with stitches (sutures), staples, or skin glue.  Your incisions may be covered with a bandage (dressing). The procedure may vary among health care providers and hospitals. What happens after the procedure?  Your blood pressure, heart rate, breathing rate, and blood oxygen level will be monitored until the medicines you were given have worn off.  You will be given medicines as needed to control your pain.  Do not drive for 24 hours if you were given a sedative. This information is not intended to replace advice given to you by your health care provider. Make sure you discuss any questions you have with your health care provider. Document Released: 02/27/2005 Document Revised: 09/19/2015 Document Reviewed: 08/16/2015 Elsevier Interactive Patient Education  2018 [employer].

## 2017-10-02 NOTE — H&P (Signed)
[facility] History and Physical  Reason for Referral: Gallbladder/ RUQ pain  Referring Physician:  Dr. Oswaldo Done      Chief Complaint    Abdominal Pain      Courtney Bush is a 22 y.o. female.  HPI: Ms. Courtney Bush is a 22 yo with RUQ pain that has been persistent since January 2019. She reports that since that time she has had worsening RUQ pain that was initially worse with greasy foods and now has been more chronic and constant in nature. She underwent a RUQ [place] that did demonstrate sludge 03/2017 and then underwent a HIDA scan that was actually normal a few weeks ago.   She continues to have this RUQ pain and some nausea/bloating with some vomiting. She says that she can wake up in the morning and the pain is there and it is limiting her ability to go to work now. She also reports a history of some bleeding with wiping after having some diarrhea. She denies any hemorrhoid issues in the past and was not on her period.   She also has a history of a grandmother with Factor V Leiden who has issues with bleeding and blood clots, and her mother and her self have been tested per her report and were found to be heterozygous for the mutation. She says that she was found to have a clot in the past when she was undergoing some vein stripping. On my review of the chart, I cannot find documentation in the [facility] system of any DVT and most notes explicitly say no DVT. I did find some documentation of the saphenous vein being occluded during a laser ablation.  I am not sure if this is what she is speaking about.   She reports however that she took medication for a few months / blood thinners. She cannot recall the name of the blood thinner.  I cannot find in this system where she has taken any blood thinners specifically.   Past Medical History:  Diagnosis Date  . Allergy    factor fived deficiency  . Asthma   . Blood clot in vein    ? question of clot in vein per the patient,  no documentation to support DVT in [facility]  . Factor V deficiency (HCC)   . Factor V Leiden carrier (HCC)   . Migraine          Past Surgical History:  Procedure Laterality Date  . TONSILLECTOMY           Family History  Problem Relation Age of Onset  . Hypertension Father   . Hyperlipidemia Father   . Factor V Leiden deficiency Maternal Grandmother   . Clotting disorder Maternal Grandmother   . Seizures Neg Hx     Social History        Tobacco Use  . Smoking status: Never Smoker  . Smokeless tobacco: Never Used  Substance Use Topics  . Alcohol use: No    Alcohol/week: 0.0 oz  . Drug use: No    Medications: I have reviewed the patient's current medications.      Allergies as of 10/02/2017      Reactions   Alpha-gal Anaphylaxis   Other Hives, Rash   Tree nuts               Medication List            Accurate as of 10/02/17  1:23 PM. Always use your most recent med list.  EPINEPHrine 0.3 mg/0.3 mL Soaj injection Commonly known as:  EPIPEN Inject 0.3 mLs (0.3 mg total) into the muscle once.   ibuprofen 200 MG tablet Commonly known as:  ADVIL,MOTRIN Take 200 mg by mouth every 6 (six) hours as needed for moderate pain.   ondansetron 8 MG tablet Commonly known as:  ZOFRAN Take 1 tablet (8 mg total) by mouth every 8 (eight) hours as needed for nausea or vomiting.   traMADol 50 MG tablet Commonly known as:  ULTRAM Take 1 tablet (50 mg total) by mouth every 6 (six) hours as needed for moderate pain.        ROS:  A comprehensive review of systems was negative except for: Gastrointestinal: positive for abdominal pain, diarrhea, nausea, vomiting and bleeding with wiping BM Hematologic/lymphatic: positive for clotting problems/ blood clot Musculoskeletal: positive for back pain Neurological: positive for headaches Endocrine: positive for tired/ sluggish  Blood pressure 129/78, pulse 79, temperature  97.8 F (36.6 C), temperature source Temporal, resp. rate 16, weight 131 lb (59.4 kg), last menstrual period 09/11/2017. Physical Exam  Constitutional: She appears well-developed.  HENT:  Head: Normocephalic and atraumatic.  Eyes: Pupils are equal, round, and reactive to light. EOM are normal.  Cardiovascular: Normal rate and regular rhythm.  Pulmonary/Chest: Effort normal and breath sounds normal.  Abdominal: Soft. Normal appearance. She exhibits no distension. There is tenderness in the right upper quadrant. There is no rigidity and no guarding.  Genitourinary: Rectal exam shows tenderness. Rectal exam shows no external hemorrhoid, no mass and anal tone normal.  Genitourinary Comments: No gross blood on rectal exam  Vitals reviewed.   Results: Labs: Results for GLORIANN, RIEDE (MRN [id number]) as of 10/02/2017 11:21  Ref. Range 09/11/2017 09:15  Sodium Latest Ref Range: 135 - 145 mmol/L 142  Potassium Latest Ref Range: 3.5 - 5.1 mmol/L 4.6  Chloride Latest Ref Range: 98 - 111 mmol/L 108  CO2 Latest Ref Range: 22 - 32 mmol/L 28  Glucose Latest Ref Range: 70 - 99 mg/dL 88  BUN Latest Ref Range: 6 - 20 mg/dL 9  Creatinine Latest Ref Range: 0.44 - 1.00 mg/dL 7.82  Calcium Latest Ref Range: 8.9 - 10.3 mg/dL 9.5  Anion gap Latest Ref Range: 5 - 15  6  Alkaline Phosphatase Latest Ref Range: 38 - 126 U/L 40  Albumin Latest Ref Range: 3.5 - 5.0 g/dL 4.6  AST Latest Ref Range: 15 - 41 U/L 20  ALT Latest Ref Range: 0 - 44 U/L 16  Total Protein Latest Ref Range: 6.5 - 8.1 g/dL 7.6  Total Bilirubin Latest Ref Range: 0.3 - 1.2 mg/dL 0.8  GFR, Est Non African American Latest Ref Range: >60 mL/min >60  GFR, Est African American Latest Ref Range: >60 mL/min >60  WBC Latest Ref Range: 4.0 - 10.5 K/uL 4.9  RBC Latest Ref Range: 3.87 - 5.11 MIL/uL 4.67  Hemoglobin Latest Ref Range: 12.0 - 15.0 g/dL 95.6  HCT Latest Ref Range: 36.0 - 46.0 % 43.8  MCV Latest Ref Range: 78.0 - 100.0 fL 93.8  MCH  Latest Ref Range: 26.0 - 34.0 pg 31.0  MCHC Latest Ref Range: 30.0 - 36.0 g/dL 21.3  RDW Latest Ref Range: 11.5 - 15.5 % 11.7  Platelets Latest Ref Range: 150 - 400 K/uL 260  Neutrophils Latest Units: % 40  Lymphocytes Latest Units: % 45  Monocytes Relative Latest Units: % 8  Eosinophil Latest Units: % 7  Basophil Latest Units: % 0  NEUT# Latest  Ref Range: 1.7 - 7.7 K/uL 2.0  Lymphocyte # Latest Ref Range: 0.7 - 4.0 K/uL 2.2  Monocyte # Latest Ref Range: 0.1 - 1.0 K/uL 0.4  Eosinophils Absolute Latest Ref Range: 0.0 - 0.7 K/uL 0.4  Basophils Absolute Latest Ref Range: 0.0 - 0.1 K/uL 0.0   [place] RUQ 03/2017 CLINICAL DATA: Epigastric and right upper quadrant pain  EXAM: ABDOMEN ULTRASOUND COMPLETE  COMPARISON: CT scan 11/21/2013  FINDINGS: Gallbladder: No gallstones or gallbladder wall thickening. No pericholecystic fluid. Sludge noted within the gallbladder lumen. The sonographer reports no sonographic Murphy's sign.  Common bile duct: Diameter: 1 mm  Liver: No focal lesion identified. Within normal limits in parenchymal echogenicity. Portal vein is patent on color Doppler imaging with normal direction of blood flow towards the liver.  IVC: No abnormality visualized.  Pancreas: Visualized portion unremarkable.  Spleen: Size and appearance within normal limits.  Right Kidney: Length: 12.2 cm. Echogenicity within normal limits. No mass or hydronephrosis visualized.  Left Kidney: Length: 12.1 cm. Echogenicity within normal limits. No mass or hydronephrosis visualized.  Abdominal aorta: No aneurysm visualized.  Other findings: None.  IMPRESSION: Sludge in the gallbladder, otherwise normal abdominal ultrasound Exam.  HIDA 09/2017 EXAM: NUCLEAR MEDICINE HEPATOBILIARY IMAGING WITH GALLBLADDER EF  TECHNIQUE: Sequential images of the abdomen were obtained out to 60 minutes following intravenous administration of radiopharmaceutical. After oral ingestion  of Ensure, gallbladder ejection fraction was determined. At 60 min, normal ejection fraction is greater than 33%.  RADIOPHARMACEUTICALS: 5.5 mCi Tc-80m Choletec IV  COMPARISON: 04/12/2017 ultrasound.  FINDINGS: Prompt uptake and biliary excretion of activity by the liver is seen. Gallbladder activity is visualized, consistent with patency of cystic duct. Biliary activity passes into small bowel, consistent with patent common bile duct.  Calculated gallbladder ejection fraction is 59%%. (Normal gallbladder ejection fraction with Ensure is greater than 33%.)  Patient with baseline abdominal pain with additional nausea with ingestion of Ensure.  IMPRESSION: Normal gallbladder ejection fraction.  Assessment & Plan:  Courtney Bush is a 22 y.o. female with gallbladder sludge that is likely causing her problems with her RUQ pain, and a history of a Factor V Leiden heterozygous mutation per her report. The patient also reporting some history of a blood clot and history of using blood thinners, but I cannot find specific documentation of this in the chart. She also reports her blood being tested and her mother's blood, saying they both have 1 copy of the mutation.  This again is not in the [facility] system. Her grandmother is also reported to have had clots.   -OR for laparoscopic cholecystectomy possible open  -Given her factor V leiden heterozygosity and possible history of clot and family history of clot in her grandmother and also history of smoking, I have reviewed the literature and there is enough recommendations to support use of prophylaxis in this patient to prevent against the formation of blood clots in the perioperative period -Plan for lovenox 40mg  injection the day of surgery and lovenox for 4 days following surgery, and precautions for the patient to be up and ambulating post operatively    PLAN: I counseled the patient about the indication, risks and benefits of  laparoscopic cholecystectomy.  She understands there is a very small chance for bleeding, infection, injury to normal structures (including common bile duct), conversion to open surgery, persistent symptoms, evolution of postcholecystectomy diarrhea, need for secondary interventions, anesthesia reaction, cardiopulmonary issues and other risks not specifically detailed here. I described the expected recovery,  the plan for follow-up and the restrictions during the recovery phase.  All questions were answered.  All questions were answered to the satisfaction of the patient and her fiance.   Lucretia RoersLindsay C Bridges 10/02/2017, 1:23 PM

## 2017-10-03 ENCOUNTER — Encounter (HOSPITAL_COMMUNITY): Payer: Self-pay

## 2017-10-03 ENCOUNTER — Encounter (HOSPITAL_COMMUNITY)
Admission: RE | Admit: 2017-10-03 | Discharge: 2017-10-03 | Disposition: A | Discharge status: Home / Self Care | Payer: BLUE CROSS/BLUE SHIELD | Source: Ambulatory Visit | Attending: General Surgery | Admitting: General Surgery

## 2017-10-05 ENCOUNTER — Encounter (HOSPITAL_COMMUNITY)
Admission: RE | Disposition: A | Discharge status: Home / Self Care | Payer: Self-pay | Source: Ambulatory Visit | Attending: General Surgery

## 2017-10-05 ENCOUNTER — Ambulatory Visit (HOSPITAL_COMMUNITY): Payer: BLUE CROSS/BLUE SHIELD | Admitting: Anesthesiology

## 2017-10-05 ENCOUNTER — Encounter (HOSPITAL_COMMUNITY): Payer: Self-pay | Admitting: *Deleted

## 2017-10-05 ENCOUNTER — Other Ambulatory Visit: Payer: Self-pay

## 2017-10-05 ENCOUNTER — Ambulatory Visit (HOSPITAL_COMMUNITY)
Admission: RE | Admit: 2017-10-05 | Discharge: 2017-10-05 | Disposition: A | Discharge status: Home / Self Care | Payer: BLUE CROSS/BLUE SHIELD | Source: Ambulatory Visit | Attending: General Surgery | Admitting: General Surgery

## 2017-10-05 DIAGNOSIS — Z91018 Allergy to other foods: Secondary | ICD-10-CM | POA: Insufficient documentation

## 2017-10-05 DIAGNOSIS — Z148 Genetic carrier of other disease: Secondary | ICD-10-CM | POA: Insufficient documentation

## 2017-10-05 DIAGNOSIS — K8010 Calculus of gallbladder with chronic cholecystitis without obstruction: Secondary | ICD-10-CM | POA: Diagnosis not present

## 2017-10-05 DIAGNOSIS — Z888 Allergy status to other drugs, medicaments and biological substances status: Secondary | ICD-10-CM | POA: Diagnosis not present

## 2017-10-05 DIAGNOSIS — J45909 Unspecified asthma, uncomplicated: Secondary | ICD-10-CM | POA: Diagnosis not present

## 2017-10-05 DIAGNOSIS — K811 Chronic cholecystitis: Secondary | ICD-10-CM | POA: Diagnosis not present

## 2017-10-05 DIAGNOSIS — K8020 Calculus of gallbladder without cholecystitis without obstruction: Secondary | ICD-10-CM | POA: Diagnosis present

## 2017-10-05 DIAGNOSIS — K828 Other specified diseases of gallbladder: Secondary | ICD-10-CM

## 2017-10-05 HISTORY — PX: CHOLECYSTECTOMY: SHX55

## 2017-10-05 LAB — PREGNANCY, URINE: Preg Test, Ur: NEGATIVE

## 2017-10-05 SURGERY — LAPAROSCOPIC CHOLECYSTECTOMY
Anesthesia: General | Site: Abdomen

## 2017-10-05 MED ORDER — SUGAMMADEX SODIUM 200 MG/2ML IV SOLN
INTRAVENOUS | Status: AC
  Filled 2017-10-05: qty 2

## 2017-10-05 MED ORDER — ROCURONIUM BROMIDE 100 MG/10ML IV SOLN
INTRAVENOUS | Status: DC | PRN
  Administered 2017-10-05: 40 mg via INTRAVENOUS

## 2017-10-05 MED ORDER — CEFAZOLIN SODIUM-DEXTROSE 2-4 GM/100ML-% IV SOLN
INTRAVENOUS | Status: AC
  Filled 2017-10-05: qty 100

## 2017-10-05 MED ORDER — OXYCODONE HCL 5 MG PO TABS: 5.0000 mg | ORAL_TABLET | ORAL | 0 refills | Status: DC | PRN

## 2017-10-05 MED ORDER — MEPERIDINE HCL 50 MG/ML IJ SOLN: 6.2500 mg | INTRAMUSCULAR | Status: DC | PRN

## 2017-10-05 MED ORDER — ONDANSETRON HCL 4 MG/2ML IJ SOLN: 4.0000 mg | Freq: Once | INTRAMUSCULAR | Status: DC | PRN

## 2017-10-05 MED ORDER — ONDANSETRON HCL 8 MG PO TABS: 8.0000 mg | ORAL_TABLET | Freq: Three times a day (TID) | ORAL | 0 refills | Status: DC | PRN

## 2017-10-05 MED ORDER — ONDANSETRON HCL 4 MG/2ML IJ SOLN
INTRAMUSCULAR | Status: DC | PRN
  Administered 2017-10-05: 4 mg via INTRAVENOUS

## 2017-10-05 MED ORDER — SUGAMMADEX SODIUM 500 MG/5ML IV SOLN
INTRAVENOUS | Status: DC | PRN
  Administered 2017-10-05: 200 mg via INTRAVENOUS

## 2017-10-05 MED ORDER — SODIUM CHLORIDE 0.9 % IR SOLN
Status: DC | PRN
  Administered 2017-10-05: 1000 mL

## 2017-10-05 MED ORDER — ROCURONIUM BROMIDE 50 MG/5ML IV SOLN
INTRAVENOUS | Status: AC
  Filled 2017-10-05: qty 2

## 2017-10-05 MED ORDER — CEFAZOLIN SODIUM-DEXTROSE 2-4 GM/100ML-% IV SOLN
2.0000 g | INTRAVENOUS | Status: AC
  Administered 2017-10-05: 2 g via INTRAVENOUS

## 2017-10-05 MED ORDER — HYDROMORPHONE HCL 1 MG/ML IJ SOLN
0.2500 mg | INTRAMUSCULAR | Status: DC | PRN
  Administered 2017-10-05: 0.5 mg via INTRAVENOUS

## 2017-10-05 MED ORDER — PROPOFOL 10 MG/ML IV BOLUS
INTRAVENOUS | Status: DC | PRN
  Administered 2017-10-05: 180 mg via INTRAVENOUS
  Administered 2017-10-05: 20 mg via INTRAVENOUS

## 2017-10-05 MED ORDER — CHLORHEXIDINE GLUCONATE CLOTH 2 % EX PADS
6.0000 | MEDICATED_PAD | Freq: Once | CUTANEOUS | Status: AC
  Administered 2017-10-05: 6 via TOPICAL

## 2017-10-05 MED ORDER — HYDROMORPHONE HCL 1 MG/ML IJ SOLN
INTRAMUSCULAR | Status: AC
  Filled 2017-10-05: qty 0.5

## 2017-10-05 MED ORDER — FENTANYL CITRATE (PF) 100 MCG/2ML IJ SOLN
INTRAMUSCULAR | Status: DC | PRN
  Administered 2017-10-05: 25 ug via INTRAVENOUS
  Administered 2017-10-05 (×2): 50 ug via INTRAVENOUS
  Administered 2017-10-05: 100 ug via INTRAVENOUS
  Administered 2017-10-05: 25 ug via INTRAVENOUS

## 2017-10-05 MED ORDER — HEMOSTATIC AGENTS (NO CHARGE) OPTIME
TOPICAL | Status: DC | PRN
  Administered 2017-10-05: 1 via TOPICAL

## 2017-10-05 MED ORDER — ENOXAPARIN SODIUM 40 MG/0.4ML ~~LOC~~ SOLN: 40.0000 mg | SUBCUTANEOUS | 0 refills | Status: DC

## 2017-10-05 MED ORDER — ENOXAPARIN SODIUM 40 MG/0.4ML ~~LOC~~ SOLN
SUBCUTANEOUS | Status: AC
  Filled 2017-10-05: qty 0.4

## 2017-10-05 MED ORDER — BUPIVACAINE HCL (PF) 0.5 % IJ SOLN
INTRAMUSCULAR | Status: AC
  Filled 2017-10-05: qty 30

## 2017-10-05 MED ORDER — DEXAMETHASONE SODIUM PHOSPHATE 4 MG/ML IJ SOLN
INTRAMUSCULAR | Status: DC | PRN
  Administered 2017-10-05: 8 mg via INTRAVENOUS

## 2017-10-05 MED ORDER — LACTATED RINGERS IV SOLN
INTRAVENOUS | Status: DC
  Administered 2017-10-05: 09:00:00 via INTRAVENOUS

## 2017-10-05 MED ORDER — ENOXAPARIN SODIUM 40 MG/0.4ML ~~LOC~~ SOLN
40.0000 mg | Freq: Once | SUBCUTANEOUS | Status: AC
  Administered 2017-10-05: 40 mg via SUBCUTANEOUS

## 2017-10-05 MED ORDER — HYDROCODONE-ACETAMINOPHEN 7.5-325 MG PO TABS
1.0000 | ORAL_TABLET | Freq: Once | ORAL | Status: AC | PRN
  Administered 2017-10-05: 1 via ORAL
  Filled 2017-10-05: qty 1

## 2017-10-05 MED ORDER — FENTANYL CITRATE (PF) 250 MCG/5ML IJ SOLN
INTRAMUSCULAR | Status: AC
  Filled 2017-10-05: qty 5

## 2017-10-05 MED ORDER — DEXAMETHASONE SODIUM PHOSPHATE 4 MG/ML IJ SOLN
INTRAMUSCULAR | Status: AC
  Filled 2017-10-05: qty 2

## 2017-10-05 MED ORDER — BUPIVACAINE HCL (PF) 0.5 % IJ SOLN
INTRAMUSCULAR | Status: DC | PRN
  Administered 2017-10-05: 10 mL

## 2017-10-05 SURGICAL SUPPLY — 45 items
ADH SKN CLS APL DERMABOND .7 (GAUZE/BANDAGES/DRESSINGS) ×1
APPLIER CLIP ROT 10 11.4 M/L (STAPLE) ×2
APR CLP MED LRG 11.4X10 (STAPLE) ×1
BAG RETRIEVAL 10 (BASKET) ×1
BLADE SURG 15 STRL LF DISP TIS (BLADE) ×1 IMPLANT
BLADE SURG 15 STRL SS (BLADE) ×2
CHLORAPREP W/TINT 26ML (MISCELLANEOUS) ×2 IMPLANT
CLIP APPLIE ROT 10 11.4 M/L (STAPLE) ×1 IMPLANT
CLOTH BEACON ORANGE TIMEOUT ST (SAFETY) ×2 IMPLANT
COVER LIGHT HANDLE STERIS (MISCELLANEOUS) ×4 IMPLANT
DECANTER SPIKE VIAL GLASS SM (MISCELLANEOUS) ×2 IMPLANT
DERMABOND ADVANCED (GAUZE/BANDAGES/DRESSINGS) ×1
DERMABOND ADVANCED .7 DNX12 (GAUZE/BANDAGES/DRESSINGS) ×1 IMPLANT
ELECT REM PT RETURN 9FT ADLT (ELECTROSURGICAL) ×2
ELECTRODE REM PT RTRN 9FT ADLT (ELECTROSURGICAL) ×1 IMPLANT
FILTER SMOKE EVAC LAPAROSHD (FILTER) ×2 IMPLANT
GLOVE BIO SURGEON STRL SZ 6.5 (GLOVE) ×2 IMPLANT
GLOVE BIO SURGEON STRL SZ7 (GLOVE) ×4 IMPLANT
GLOVE BIOGEL PI IND STRL 6.5 (GLOVE) ×1 IMPLANT
GLOVE BIOGEL PI IND STRL 7.0 (GLOVE) ×3 IMPLANT
GLOVE BIOGEL PI INDICATOR 6.5 (GLOVE) ×1
GLOVE BIOGEL PI INDICATOR 7.0 (GLOVE) ×3
GOWN STRL REUS W/TWL LRG LVL3 (GOWN DISPOSABLE) ×6 IMPLANT
HEMOSTAT SNOW SURGICEL 2X4 (HEMOSTASIS) ×2 IMPLANT
INST SET LAPROSCOPIC AP (KITS) ×2 IMPLANT
IV NS IRRIG 3000ML ARTHROMATIC (IV SOLUTION) IMPLANT
KIT TURNOVER KIT A (KITS) ×2 IMPLANT
MANIFOLD NEPTUNE II (INSTRUMENTS) ×2 IMPLANT
NEEDLE INSUFFLATION 14GA 120MM (NEEDLE) ×2 IMPLANT
NS IRRIG 1000ML POUR BTL (IV SOLUTION) ×2 IMPLANT
PACK LAP CHOLE LZT030E (CUSTOM PROCEDURE TRAY) ×2 IMPLANT
PAD ARMBOARD 7.5X6 YLW CONV (MISCELLANEOUS) ×2 IMPLANT
SET BASIN LINEN APH (SET/KITS/TRAYS/PACK) ×2 IMPLANT
SET TUBE IRRIG SUCTION NO TIP (IRRIGATION / IRRIGATOR) IMPLANT
SLEEVE ENDOPATH XCEL 5M (ENDOMECHANICALS) ×2 IMPLANT
SUT MNCRL AB 4-0 PS2 18 (SUTURE) ×2 IMPLANT
SUT VICRYL 0 UR6 27IN ABS (SUTURE) ×2 IMPLANT
SYS BAG RETRIEVAL 10MM (BASKET) ×1
SYSTEM BAG RETRIEVAL 10MM (BASKET) ×1 IMPLANT
TROCAR ENDO BLADELESS 11MM (ENDOMECHANICALS) ×2 IMPLANT
TROCAR XCEL NON-BLD 5MMX100MML (ENDOMECHANICALS) ×2 IMPLANT
TROCAR XCEL UNIV SLVE 11M 100M (ENDOMECHANICALS) ×2 IMPLANT
TUBE CONNECTING 12X1/4 (SUCTIONS) ×2 IMPLANT
TUBING INSUFFLATION (TUBING) ×2 IMPLANT
WARMER LAPAROSCOPE (MISCELLANEOUS) ×2 IMPLANT

## 2017-10-05 NOTE — Transfer of Care (Signed)
Immediate Anesthesia Transfer of Care Note  Patient: Courtney Bush  Procedure(s) Performed: LAPAROSCOPIC CHOLECYSTECTOMY (N/A Abdomen)  Patient Location: PACU  Anesthesia Type:General  Level of Consciousness: awake and patient cooperative  Airway & Oxygen Therapy: Patient Spontanous Breathing  Post-op Assessment: Report given to RN and Post -op Vital signs reviewed and stable  Post vital signs: Reviewed and stable  Last Vitals:  Vitals Value Taken Time  BP    Temp    Pulse 85 10/05/2017  2:51 PM  Resp 24 10/05/2017  2:51 PM  SpO2 100 % 10/05/2017  2:51 PM  Vitals shown include unvalidated device data.  Last Pain:  Vitals:   10/05/17 0854  TempSrc: Oral  PainSc: 0-No pain      Patients Stated Pain Goal: 9 (10/05/17 0854)  Complications: No apparent anesthesia complications

## 2017-10-05 NOTE — Interval H&P Note (Signed)
History and Physical Interval Note:  10/05/2017 1:12 PM  Courtney Bush  has presented today for surgery, with the diagnosis of gallbladder sludge  The various methods of treatment have been discussed with the patient and family. After consideration of risks, benefits and other options for treatment, the patient has consented to  Procedure(s): LAPAROSCOPIC CHOLECYSTECTOMY (N/A) as a surgical intervention .  The patient's history has been reviewed, patient examined, no change in status, stable for surgery.  I have reviewed the patient's chart and labs.  Questions were answered to the patient's satisfaction.    No major issues. No changes. Lovenox shot given for her heterozygous factor V leiden with family history of clots and personal history of what sounds more like a superficial clot.  As long as no bleeding issues with surgery, going to send with some lovenox 40mg  daily given her risk based on recommendations.   Courtney Bush

## 2017-10-05 NOTE — Anesthesia Postprocedure Evaluation (Signed)
Anesthesia Post Note  Patient: Courtney Bush N White  Procedure(s) Performed: LAPAROSCOPIC CHOLECYSTECTOMY (N/A Abdomen)  Patient location during evaluation: PACU Anesthesia Type: General Level of consciousness: awake and alert and patient cooperative Pain management: satisfactory to patient Vital Signs Assessment: post-procedure vital signs reviewed and stable Respiratory status: spontaneous breathing Cardiovascular status: stable Postop Assessment: no apparent nausea or vomiting Anesthetic complications: no     Last Vitals:  Vitals:   10/05/17 1450 10/05/17 1500  BP: (!) 122/105 111/75  Pulse: 93 66  Resp: (!) 31 17  Temp: 36.5 C   SpO2: 100% 100%    Last Pain:  Vitals:   10/05/17 1505  TempSrc:   PainSc: 8                  Karn Derk

## 2017-10-05 NOTE — Op Note (Addendum)
Operative Note   Preoperative Diagnosis: Symptomatic cholelithiasis/ gallbladder sludge   Postoperative Diagnosis: Same   Procedure(s) Performed: Laparoscopic cholecystectomy   Surgeon: Lillia AbedLindsay C. Henreitta LeberBridges, MD   Assistants: No qualified resident was available   Anesthesia: General endotracheal   Anesthesiologist: Dr. Alta Corningseng     Specimens: Gallbladder    Estimated Blood Loss: Minimal    Blood Replacement: None    Complications: None    Operative Findings: Distended gallbladder    Procedure: The patient was taken to the operating room and placed supine. General endotracheal anesthesia was induced. Intravenous antibiotics were administered per protocol. An orogastric tube positioned to decompress the stomach. The abdomen was prepared and draped in the usual sterile fashion.    A supraumbilical incision was made and a Veress technique was utilized to achieve pneumoperitoneum to 15 mmHg with carbon dioxide. A 11 mm optiview port was placed through the supraumbilical region, and a 10 mm 0-degree operative laparoscope was introduced. The area underlying the trocar and Veress needle were inspected and without evidence of injury.  Remaining trocars were placed under direct vision. Two 5 mm ports were placed in the right abdomen, between the anterior axillary and midclavicular line.  A final 11 mm port was placed through the mid-epigastrium, near the falciform ligament.    The gallbladder fundus was elevated cephalad and the infundibulum was retracted to the patient's right. The gallbladder/cystic duct junction was skeletonized. The cystic artery noted in the triangle of Calot and was also skeletonized.  We then continued liberal medial and lateral dissection until the critical view of safety was achieved.    The cystic duct and cystic artery were doubly clipped and divided. The gallbladder was then dissected from the liver bed with electrocautery. The specimen was placed in an Endopouch and was  retrieved through the epigastric site.   Final inspection revealed acceptable hemostasis. Surgical snow was placed in the gallbladder bed. The epigastric port site was at the level of the ribs and the umbilical site was smaller than my finger tip. Trocars were removed and pneumoperitoneum was released. Skin incisions were closed with 4-0 Monocryl subcuticular sutures and Dermabond. The patient was awakened from anesthesia and extubated without complication.    Algis GreenhouseLindsay Estanislado Surgeon, MD [facility] [address] [place], [place] [phone] (office)

## 2017-10-05 NOTE — Anesthesia Procedure Notes (Signed)
Procedure Name: Intubation Date/Time: 10/05/2017 1:58 PM Performed by: Vista Deck, CRNA Pre-anesthesia Checklist: Patient identified, Patient being monitored, Timeout performed, Emergency Drugs available and Suction available Patient Re-evaluated:Patient Re-evaluated prior to induction Oxygen Delivery Method: Circle System Utilized Preoxygenation: Pre-oxygenation with 100% oxygen Induction Type: IV induction Ventilation: Mask ventilation without difficulty Laryngoscope Size: Mac and 3 Grade View: Grade II Tube type: Oral Tube size: 7.0 mm Number of attempts: 1 Airway Equipment and Method: stylet Placement Confirmation: ETT inserted through vocal cords under direct vision,  positive ETCO2 and breath sounds checked- equal and bilateral Secured at: 22 cm Tube secured with: Tape Dental Injury: Teeth and Oropharynx as per pre-operative assessment

## 2017-10-05 NOTE — Discharge Instructions (Signed)
Discharge Instructions: Shower per your regular routine. Take tylenol as needed for pain control, alternating every 4-6 hours.  Hold on ibuprofen until not taking lovenox shots.  Take Roxicodone for breakthrough pain. Take colace over the counter for constipation related to narcotic pain medication. Do not pick at the dermabond glue on your incision sites.  Take lovenox daily starting 7/27.  I have printed a coupon for lovenox 40 mg injections, if the lovenox is not covered by your insurance.  This should make them cheaper at [facility] where your prescriptions were sent.     Laparoscopic Cholecystectomy, Care After This sheet gives you information about how to care for yourself after your procedure. Your doctor may also give you more specific instructions. If you have problems or questions, contact your doctor. Follow these instructions at home: Care for cuts from surgery (incisions)   Follow instructions from your doctor about how to take care of your cuts from surgery. Make sure you: ? Wash your hands with soap and water before you change your bandage (dressing). If you cannot use soap and water, use hand sanitizer. ? Change your bandage as told by your doctor. ? Leave stitches (sutures), skin glue, or skin tape (adhesive) strips in place. They may need to stay in place for 2 weeks or longer. If tape strips get loose and curl up, you may trim the loose edges. Do not remove tape strips completely unless your doctor says it is okay.  Do not take baths, swim, or use a hot tub until your doctor says it is okay.   Check your surgical cut area every day for signs of infection. Check for: ? More redness, swelling, or pain. ? More fluid or blood. ? Warmth. ? Pus or a bad smell. Activity  Do not drive or use heavy machinery while taking prescription pain medicine.  Do not lift anything that is heavier than 10 lb (4.5 kg) until your doctor says it is okay.  Do not play contact sports until  your doctor says it is okay.  Do not drive for 24 hours if you were given a medicine to help you relax (sedative).  Rest as needed. Do not return to work or school until your doctor says it is okay. General instructions  Take over-the-counter and prescription medicines only as told by your doctor.  To prevent or treat constipation while you are taking prescription pain medicine, your doctor may recommend that you: ? Drink enough fluid to keep your pee (urine) clear or pale yellow. ? Take over-the-counter or prescription medicines. ? Eat foods that are high in fiber, such as fresh fruits and vegetables, whole grains, and beans. ? Limit foods that are high in fat and processed sugars, such as fried and sweet foods. Contact a doctor if:  You develop a rash.  You have more redness, swelling, or pain around your surgical cuts.  You have more fluid or blood coming from your surgical cuts.  Your surgical cuts feel warm to the touch.  You have pus or a bad smell coming from your surgical cuts.  You have a fever.  One or more of your surgical cuts breaks open. Get help right away if:  You have trouble breathing.  You have chest pain.  You have pain that is getting worse in your shoulders.  You faint or feel dizzy when you stand.  You have very bad pain in your belly (abdomen).  You are sick to your stomach (nauseous) for more than  one day.  You have throwing up (vomiting) that lasts for more than one day.  You have leg pain. This information is not intended to replace advice given to you by your health care provider. Make sure you discuss any questions you have with your health care provider. Document Released: 12/07/2007 Document Revised: 09/18/2015 Document Reviewed: 08/16/2015 Elsevier Interactive Patient Education  2018 [employer].   How and Where to Give Subcutaneous Enoxaparin Injections Enoxaparin is an injectable medicine. It is used to help prevent blood clots  from developing in your veins. Health care providers often use anticoagulants like enoxaparin to prevent clots following surgery. Enoxaparin is also used in combination with other medicines to treat blood clots and heart attacks. If blood clots are left untreated, they can be life threatening. Enoxaparin comes in single-use syringes. You inject enoxaparin through a syringe into your belly (abdomen). You should change the injection site each time you give yourself a shot. Continue the enoxaparin injections as directed by your health care provider. Your health care provider will use blood clotting test results to decide when you can safely stop using enoxaparin injections. If your health care provider prescribes any additional medicines, use the medicines exactly as directed. How do I inject enoxaparin? 1. Wash your hands with soap and water. 2. Clean the selected injection site as directed by your health care provider. 3. Remove the needle cap by pulling it straight off the syringe. 4. When using a prefilled syringe, do not push the air bubble out of the syringe before the injection. The air bubble will help you get all of the medicine out of the syringe. 5. Hold the syringe like a pencil using your writing hand. 6. Use your other hand to pinch and hold an inch of the cleansed skin. 7. Insert the entire needle straight down into the fold of skin. 8. Push the plunger with your thumb until the syringe is empty. 9. Pull the needle straight out of your skin. 10. Enoxaparin injection prefilled syringes and graduated prefilled syringes are available with a system that shields the needle after injection. After you have completed your injection and removed the needle from your skin, firmly push down on the plunger. The protective sleeve will automatically cover the needle and you will hear a click. The click means the needle is safely covered. 11. Place the syringe in the nearest needle box, also called a sharps  container. If you do not have a sharps container, you can use a hard-sided plastic container with a secure lid, such as an empty laundry detergent bottle. What else do I need to know?  Do not use enoxaparin if: ? You have allergies to heparin or pork products. ? You have been diagnosed with a condition called thrombocytopenia.  Do not use the syringe or needle more than one time.  Use medicines only as directed by your health care provider.  Changes in medicines, supplements, diet, and illness can affect your anticoagulation therapy. Be sure to inform your health care provider of any of these changes.  It is important that you tell all of your health care providers and your dentist that you are taking an anticoagulant, especially if you are injured or plan to have any type of procedure.  While on anticoagulants, you will need to have blood tests done routinely as directed by your health care provider.  While using this medicine, avoid physical activities or sports that could result in a fall or cause injury.  Follow up  with your laboratory test and health care provider appointments as directed. It is very important to keep your appointments. Not keeping appointments could result in a chronic or permanent injury, pain, or disability.  Before giving your medicine, you should make sure the injection is a clear and colorless or pale yellow solution. If your medicine becomes discolored or if there are particles in the syringe, do not use it and notify your health care provider.  Keep your medicine safely stored at room temperature. Contact a health care provider if:  You develop any rashes on your skin.  You have large areas of bruising on your skin.  You have any worsening of the condition for which you take Enoxaparin.  You develop a fever. Get help right away if:  You develop bleeding problems such as: ? Bleeding from the gums or nose that does not stop quickly. ? Vomiting blood or  coughing up blood. ? Blood in your urine. ? Blood in your stool, or stool that has a dark, tarry, or coffee grounds appearance. ? A cut that does not stop bleeding within 10 minutes. These symptoms may represent a serious problem that is an emergency. Do not wait to see if the symptoms will go away. Get medical help right away. Call your local emergency services (911 in the [place] Do not drive yourself to the hospital. This information is not intended to replace advice given to you by your health care provider. Make sure you discuss any questions you have with your health care provider. Document Released: 12/30/2003 Document Revised: 11/04/2015 Document Reviewed: 08/14/2013 Elsevier Interactive Patient Education  2018 [employer]      General Anesthesia, Adult, Care After These instructions provide you with information about caring for yourself after your procedure. Your health care provider may also give you more specific instructions. Your treatment has been planned according to current medical practices, but problems sometimes occur. Call your health care provider if you have any problems or questions after your procedure. What can I expect after the procedure? After the procedure, it is common to have:  Vomiting.  A sore throat.  Mental slowness.  It is common to feel:  Nauseous.  Cold or shivery.  Sleepy.  Tired.  Sore or achy, even in parts of your body where you did not have surgery.  Follow these instructions at home: For at least 24 hours after the procedure:  Do not: ? Participate in activities where you could fall or become injured. ? Drive. ? Use heavy machinery. ? Drink alcohol. ? Take sleeping pills or medicines that cause drowsiness. ? Make important decisions or sign legal documents. ? Take care of children on your own.  Rest. Eating and drinking  If you vomit, drink water, juice, or soup when you can drink without vomiting.  Drink enough fluid  to keep your urine clear or pale yellow.  Make sure you have little or no nausea before eating solid foods.  Follow the diet recommended by your health care provider. General instructions  Have a responsible adult stay with you until you are awake and alert.  Return to your normal activities as told by your health care provider. Ask your health care provider what activities are safe for you.  Take over-the-counter and prescription medicines only as told by your health care provider.  If you smoke, do not smoke without supervision.  Keep all follow-up visits as told by your health care provider. This is important. Contact a health care provider  if:  You continue to have nausea or vomiting at home, and medicines are not helpful.  You cannot drink fluids or start eating again.  You cannot urinate after 8-12 hours.  You develop a skin rash.  You have fever.  You have increasing redness at the site of your procedure. Get help right away if:  You have difficulty breathing.  You have chest pain.  You have unexpected bleeding.  You feel that you are having a life-threatening or urgent problem. This information is not intended to replace advice given to you by your health care provider. Make sure you discuss any questions you have with your health care provider. Document Released: 06/05/2000 Document Revised: 08/02/2015 Document Reviewed: 02/11/2015 Elsevier Interactive Patient Education  [employer].

## 2017-10-05 NOTE — Anesthesia Preprocedure Evaluation (Signed)
Anesthesia Evaluation  Patient identified by MRN, date of birth, ID band Patient awake    Reviewed: Allergy & Precautions, H&P , NPO status , Patient's Chart, lab work & pertinent test results, reviewed documented beta blocker date and time   Airway Mallampati: II  TM Distance: >3 FB Neck ROM: full    Dental no notable dental hx.    Pulmonary neg pulmonary ROS, asthma ,    Pulmonary exam normal breath sounds clear to auscultation       Cardiovascular Exercise Tolerance: Good negative cardio ROS   Rhythm:regular Rate:Normal     Neuro/Psych negative neurological ROS  negative psych ROS   GI/Hepatic negative GI ROS, Neg liver ROS,   Endo/Other  negative endocrine ROS  Renal/GU negative Renal ROS  negative genitourinary   Musculoskeletal   Abdominal   Peds  Hematology negative hematology ROS (+)   Anesthesia Other Findings   Reproductive/Obstetrics negative OB ROS                             Anesthesia Physical Anesthesia Plan  ASA: II  Anesthesia Plan: General and General ETT   Post-op Pain Management:    Induction:   PONV Risk Score and Plan:   Airway Management Planned:   Additional Equipment:   Intra-op Plan:   Post-operative Plan:   Informed Consent: I have reviewed the patients History and Physical, chart, labs and discussed the procedure including the risks, benefits and alternatives for the proposed anesthesia with the patient or authorized representative who has indicated his/her understanding and acceptance.   Dental Advisory Given  Plan Discussed with: CRNA  Anesthesia Plan Comments:         Anesthesia Quick Evaluation

## 2017-10-08 ENCOUNTER — Encounter (HOSPITAL_COMMUNITY): Payer: Self-pay | Admitting: General Surgery

## 2017-10-12 ENCOUNTER — Telehealth: Payer: Self-pay | Admitting: Emergency Medicine

## 2017-10-12 ENCOUNTER — Ambulatory Visit: Payer: BLUE CROSS/BLUE SHIELD | Admitting: Family Medicine

## 2017-10-12 NOTE — Telephone Encounter (Signed)
Patient called and stated she is having lower back pain with vaginal discharge and an  Odor. Patient stated she was not sure if it had anything to do with her resent surgery or a uti. I notified her that it does not sound like those symptoms are related to her surgery and to follow up with her pcp or an obgy. Patient stated she understood and that she would.

## 2017-10-16 ENCOUNTER — Encounter: Payer: Self-pay | Admitting: Pediatrics

## 2017-12-27 ENCOUNTER — Ambulatory Visit: Payer: BLUE CROSS/BLUE SHIELD | Admitting: Family

## 2017-12-27 DIAGNOSIS — N730 Acute parametritis and pelvic cellulitis: Secondary | ICD-10-CM | POA: Diagnosis not present

## 2017-12-27 DIAGNOSIS — N898 Other specified noninflammatory disorders of vagina: Secondary | ICD-10-CM | POA: Diagnosis not present

## 2017-12-27 DIAGNOSIS — Z6820 Body mass index (BMI) 20.0-20.9, adult: Secondary | ICD-10-CM | POA: Diagnosis not present

## 2018-01-14 ENCOUNTER — Other Ambulatory Visit (HOSPITAL_COMMUNITY)
Admission: RE | Admit: 2018-01-14 | Discharge: 2018-01-14 | Disposition: A | Discharge status: Home / Self Care | Payer: BLUE CROSS/BLUE SHIELD | Source: Ambulatory Visit | Attending: Obstetrics & Gynecology | Admitting: Obstetrics & Gynecology

## 2018-01-14 ENCOUNTER — Encounter: Payer: Self-pay | Admitting: Adult Health

## 2018-01-14 ENCOUNTER — Ambulatory Visit (INDEPENDENT_AMBULATORY_CARE_PROVIDER_SITE_OTHER): Payer: BLUE CROSS/BLUE SHIELD | Admitting: Adult Health

## 2018-01-14 VITALS — BP 122/79 | HR 96 | Ht 66.0 in | Wt 132.5 lb

## 2018-01-14 DIAGNOSIS — Z01419 Encounter for gynecological examination (general) (routine) without abnormal findings: Secondary | ICD-10-CM

## 2018-01-14 NOTE — Progress Notes (Signed)
Patient ID: Courtney Bush, female   DOB: 01-17-1996, 22 y.o.   MRN: [id number] History of Present Illness: Courtney Bush is a 22 year old white female in for well woman gyn exam and pap.She works at [employer] and is getting married 01/19/18. And she had GB out in July 2019. She has Factor V Leiden.  PCP is Rex Kras, MD.   Current Medications, Allergies, Past Medical History, Past Surgical History, Family History and Social History were reviewed in [facility] medical record.     Review of Systems: Patient denies any headaches, hearing loss, fatigue, blurred vision, shortness of breath, chest pain, abdominal pain, problems with bowel movements, urination, or intercourse. No joint pain or mood swings.    Physical Exam:BP 122/79 (BP Location: Left Arm, Patient Position: Sitting, Cuff Size: Normal)   Pulse 96   Ht 5\' 6"  (1.676 m)   Wt 132 lb 8 oz (60.1 kg)   LMP 12/31/2017 (Approximate)   BMI 21.39 kg/m  General:  Well developed, well nourished, no acute distress Skin:  Warm and dry Neck:  Midline trachea, normal thyroid, good ROM,has swollen lymph node on right, non tender and mobile, has seen ENT. Lungs; Clear to auscultation bilaterally Breast:  No dominant palpable mass, retraction, or nipple discharge Cardiovascular: Regular rate and rhythm Abdomen:  Soft, non tender, no hepatosplenomegaly Pelvic:  External genitalia is normal in appearance, no lesions.  The vagina is normal in appearance. Urethra has no lesions or masses. The cervix is smooth, pap with reflex HPV performed.  Uterus is felt to be normal size, shape, and contour.  No adnexal masses or tenderness noted.Bladder is non tender, no masses felt. Extremities/musculoskeletal:  No swelling or varicosities noted, no clubbing or cyanosis Psych:  No mood changes, alert and cooperative,seems happy PHQ 2 score 0. Examination chaperoned by Malachy Mood LPN.  Impression: 1. Encounter for gynecological  examination with Papanicolaou smear of cervix       Plan: Physical in 1 year Pap in 3 years if normal Use condoms  Try to stop smoking after wedding

## 2018-01-16 LAB — CYTOLOGY - PAP: Diagnosis: NEGATIVE

## 2018-02-10 IMAGING — CT CT HEAD W/O CM
4 series · 17 of 47 positions shown, 19 images · non-contrast
Comparison: 11/21/2013

CLINICAL DATA: Headaches for several weeks

EXAM:
CT HEAD WITHOUT CONTRAST
TECHNIQUE: Contiguous axial images were obtained from the base of the skull
through the vertex without intravenous contrast.

[Series 3: head wo · axial · 0.45mm/px · z∈[-90,+20]mm · 7 of 30 slices shown, 9 images]
[im 4/30  brain]
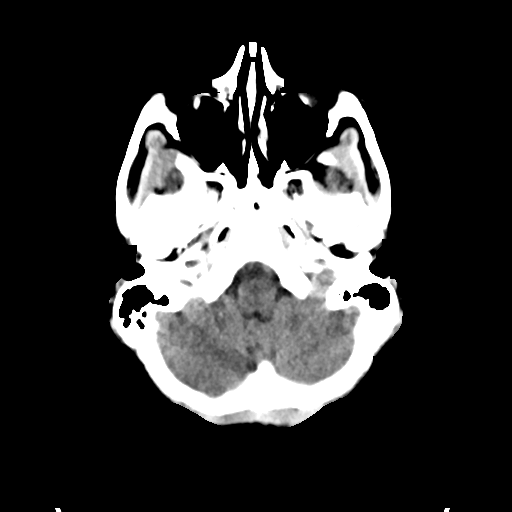
[im 4/30  bone]
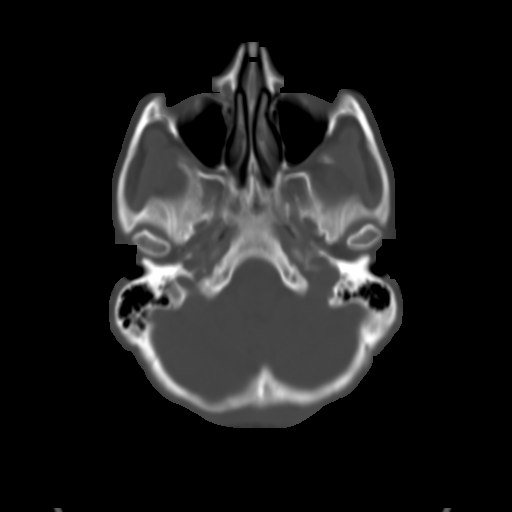
[im 8/30  brain]
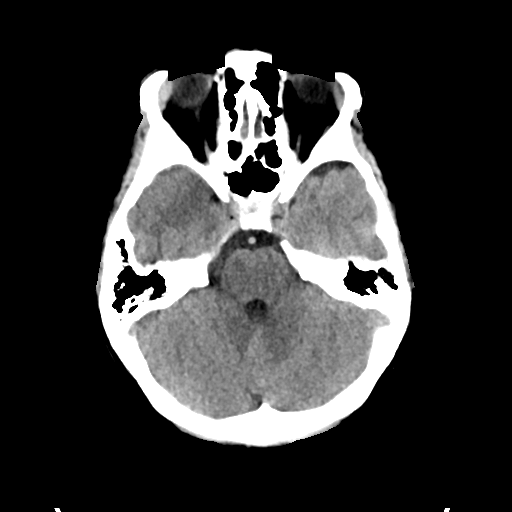
[im 11/30  brain]
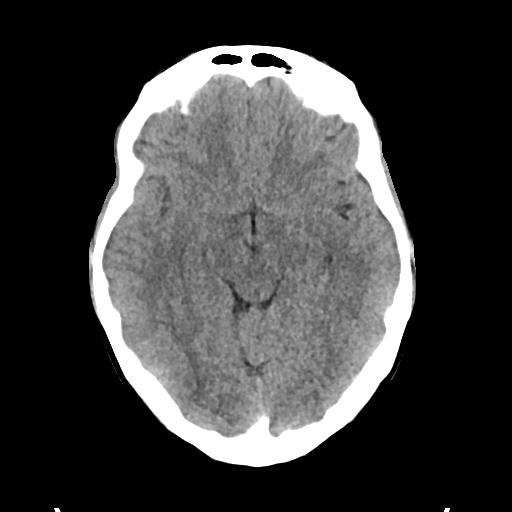
[im 15/30  brain]
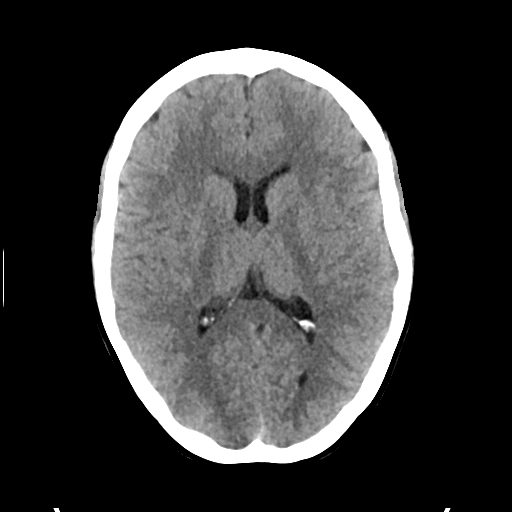
[im 19/30  brain]
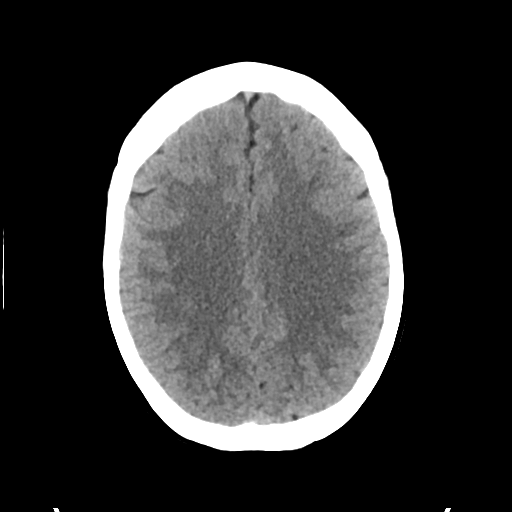
[im 19/30  bone]
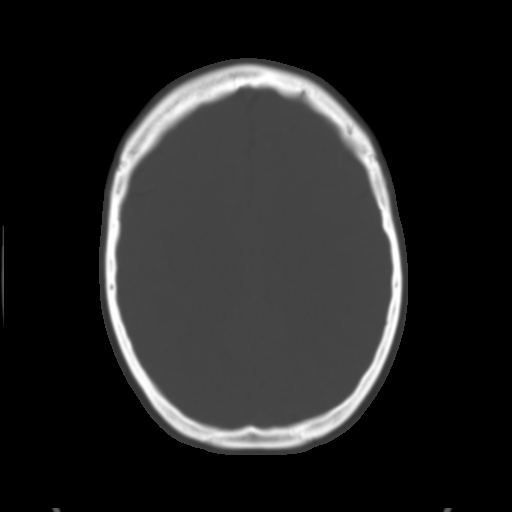
[im 22/30  brain]
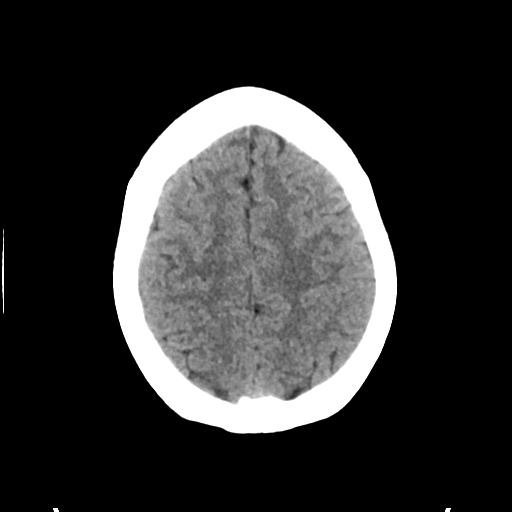
[im 26/30  brain]
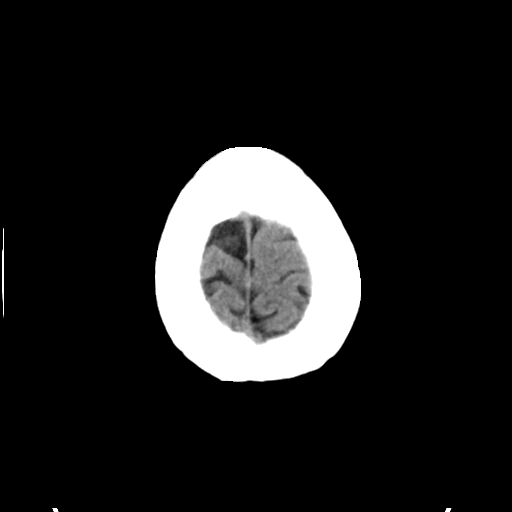

[Series 4: head bone · axial · 0.45mm/px · z∈[-90,-38]mm · 4 of 75 slices shown]
[im 8/75  bone]
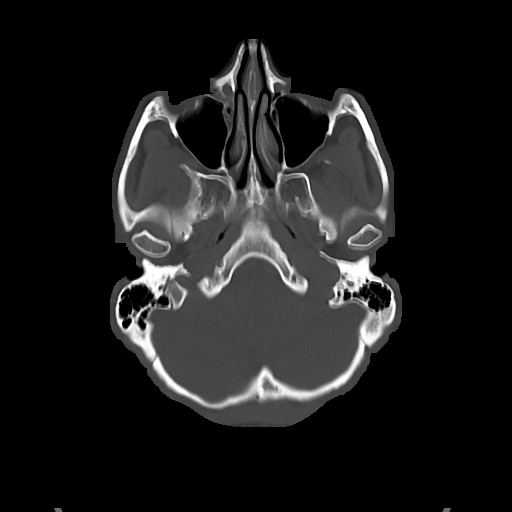
[im 15/75  bone]
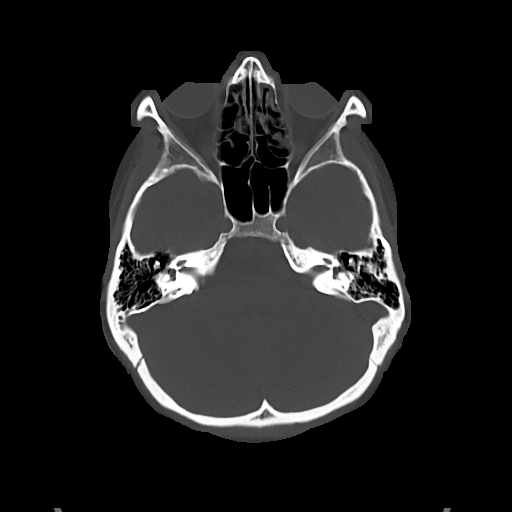
[im 23/75  bone]
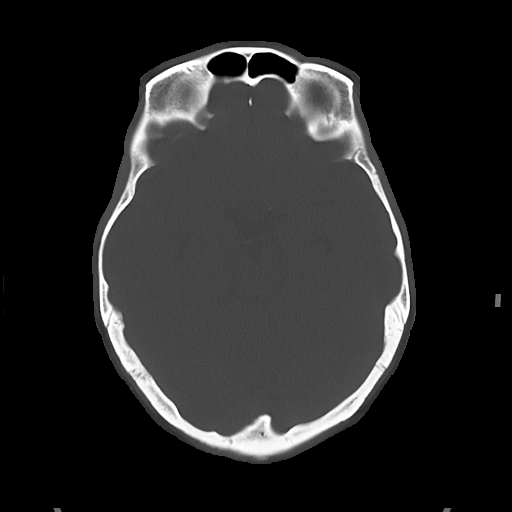
[im 34/75  bone]
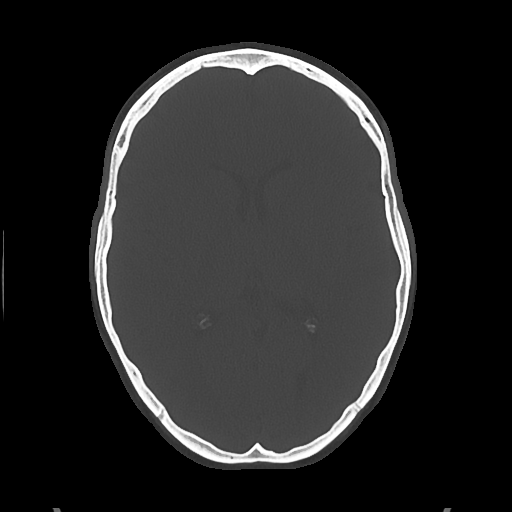

[Series 5: cor soft · coronal · 0.33mm/px · 3 of 67 slices shown]
[im 23/67  brain]
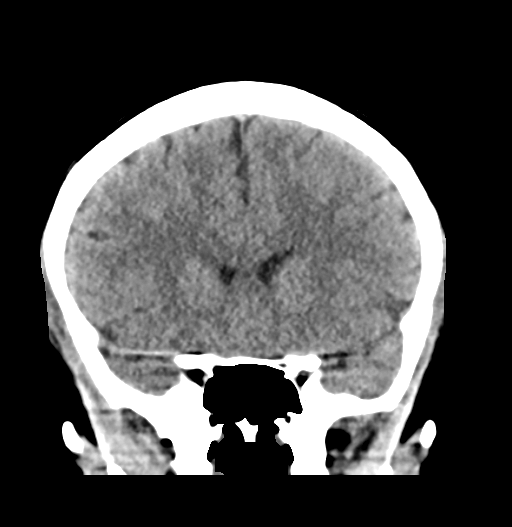
[im 30/67  brain]
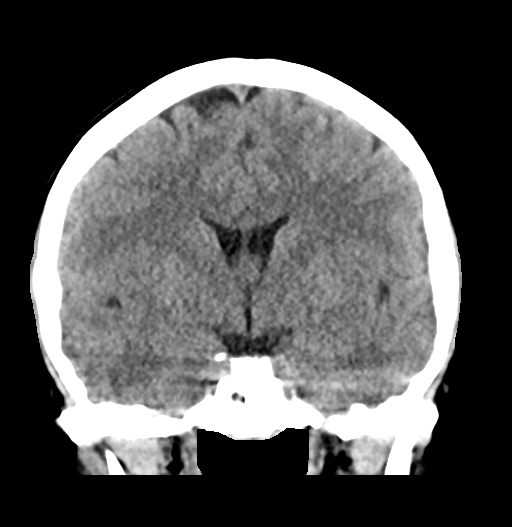
[im 37/67  brain]
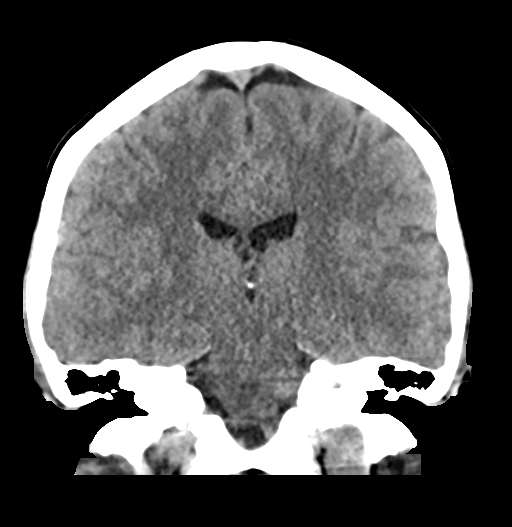

[Series 6: sag soft · sagittal · 0.32mm/px · 3 of 67 slices shown]
[im 23/67  brain]
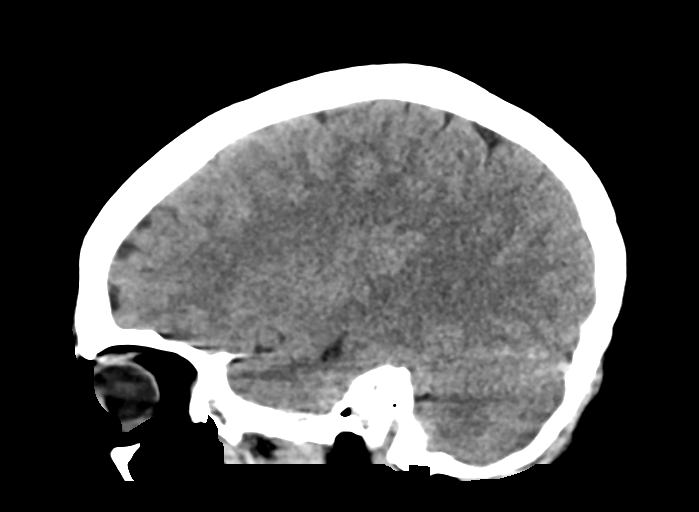
[im 34/67  brain]
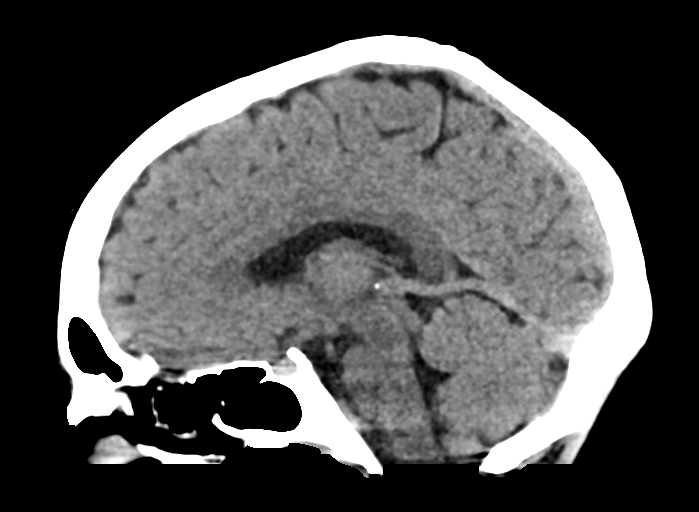
[im 45/67  brain]
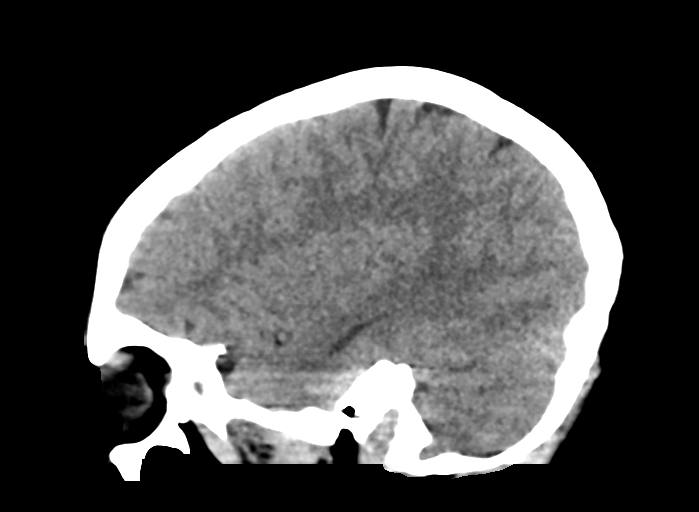

[17 of 47 positions shown; findings below may reference images not displayed]

FINDINGS: Brain: No evidence of acute infarction, hemorrhage, hydrocephalus,
extra-axial collection or mass lesion/mass effect.

Vascular: No hyperdense vessel or unexpected calcification.

Skull: Normal. Negative for fracture or focal lesion.

Sinuses/Orbits: Mild mucosal thickening is noted within the
maxillary antra bilaterally.

Other: None.
IMPRESSION: No acute intracranial abnormality is noted.

Mucosal thickening of the maxillary antra is noted bilaterally.

## 2018-02-25 ENCOUNTER — Emergency Department (HOSPITAL_COMMUNITY)
Admission: EM | Admit: 2018-02-25 | Discharge: 2018-02-26 | Disposition: A | Discharge status: Home / Self Care | Payer: BLUE CROSS/BLUE SHIELD | Attending: Emergency Medicine | Admitting: Emergency Medicine

## 2018-02-25 ENCOUNTER — Other Ambulatory Visit: Payer: Self-pay

## 2018-02-25 ENCOUNTER — Encounter (HOSPITAL_COMMUNITY): Payer: Self-pay | Admitting: Emergency Medicine

## 2018-02-25 DIAGNOSIS — H109 Unspecified conjunctivitis: Secondary | ICD-10-CM | POA: Diagnosis not present

## 2018-02-25 DIAGNOSIS — H5711 Ocular pain, right eye: Secondary | ICD-10-CM | POA: Diagnosis not present

## 2018-02-25 DIAGNOSIS — F17210 Nicotine dependence, cigarettes, uncomplicated: Secondary | ICD-10-CM | POA: Diagnosis not present

## 2018-02-25 DIAGNOSIS — J45909 Unspecified asthma, uncomplicated: Secondary | ICD-10-CM | POA: Diagnosis not present

## 2018-02-25 DIAGNOSIS — H1031 Unspecified acute conjunctivitis, right eye: Secondary | ICD-10-CM | POA: Insufficient documentation

## 2018-02-25 MED ORDER — GATIFLOXACIN 0.5 % OP SOLN
1.0000 [drp] | Freq: Four times a day (QID) | OPHTHALMIC | Status: DC
  Filled 2018-02-25: qty 2.5

## 2018-02-25 MED ORDER — DIPHENHYDRAMINE HCL 25 MG PO CAPS
50.0000 mg | ORAL_CAPSULE | Freq: Once | ORAL | Status: DC
  Filled 2018-02-25: qty 2

## 2018-02-25 MED ORDER — TETRACAINE HCL 0.5 % OP SOLN
1.0000 [drp] | Freq: Once | OPHTHALMIC | Status: AC
  Administered 2018-02-26: 1 [drp] via OPHTHALMIC
  Filled 2018-02-25: qty 4

## 2018-02-25 MED ORDER — KETOROLAC TROMETHAMINE 0.5 % OP SOLN
1.0000 [drp] | Freq: Once | OPHTHALMIC | Status: DC
  Filled 2018-02-25: qty 5

## 2018-02-25 MED ORDER — FLUORESCEIN SODIUM 1 MG OP STRP
1.0000 | ORAL_STRIP | Freq: Once | OPHTHALMIC | Status: AC
  Administered 2018-02-26: 1 via OPHTHALMIC
  Filled 2018-02-25: qty 1

## 2018-02-25 NOTE — ED Provider Notes (Addendum)
[facility] EMERGENCY DEPARTMENT Provider Note   CSN: [id number] Arrival date & time: 02/25/18  2210     History   Chief Complaint Chief Complaint  Patient presents with  . Eye Pain    HPI Courtney Bush is a 22 y.o. female.  Patient presents to the emergency department for evaluation of right eye pain.  Patient reports that she was watching TV when she suddenly started having pain in the upper outer portion of the right eye.  The eye has been very irritated since.  She reports that it now is red and swollen.  She denies any injury, did not feel like anything got into her eye.  She does not wear contacts.  She does have a tree nut allergy, no known exposure and no other rash or signs of systemic allergic reaction.     Past Medical History:  Diagnosis Date  . Allergy    factor fived deficiency  . Asthma   . Blood clot in vein    ? question of clot in vein per the patient, no documentation to support DVT in [facility]  . Factor V deficiency (HCC)   . Factor V Leiden carrier (HCC)   . Migraine     Patient Active Problem List   Diagnosis Date Noted  . Gallbladder sludge 10/02/2017  . Factor V Leiden (HCC) 01/17/2017  . Allergy to alpha-gal 01/17/2017  . Varicose veins of right lower extremity with complications 09/06/2015  . Varicose veins of right lower extremity 12/28/2014  . Migraine 12/03/2014  . Tree nut allergy 12/03/2014  . Allergic rhinitis 12/03/2014  . Varicose veins of leg with complications 10/27/2014    Past Surgical History:  Procedure Laterality Date  . CHOLECYSTECTOMY N/A 10/05/2017   Procedure: LAPAROSCOPIC CHOLECYSTECTOMY;  Surgeon: Lucretia Roers, MD;  Location: [facility];  Service: General;  Laterality: N/A;  . TONSILLECTOMY    . VEIN SURGERY  12/2014  . WISDOM TOOTH EXTRACTION  2015     OB History    Gravida  0   Para  0   Term  0   Preterm  0   AB  0   Living  0     SAB  0   TAB  0   Ectopic  0   Multiple  0   Live  Births  0            Home Medications    Prior to Admission medications   Medication Sig Start Date End Date Taking? Authorizing Provider  EPINEPHrine (EPIPEN) 0.3 mg/0.3 mL SOAJ Inject 0.3 mLs (0.3 mg total) into the muscle once. 10/04/12   Floydene Flock, MD  ibuprofen (ADVIL,MOTRIN) 200 MG tablet Take 400 mg by mouth every 6 (six) hours as needed for moderate pain.     [provider]    Family History Family History  Problem Relation Age of Onset  . Hypertension Father   . Hyperlipidemia Father   . High Cholesterol Father   . Graves' disease Mother   . Factor V Leiden deficiency Maternal Grandmother   . Clotting disorder Maternal Grandmother   . Seizures Neg Hx     Social History Social History   Tobacco Use  . Smoking status: Current Every Day Smoker    Packs/day: 0.50    Years: 4.00    Pack years: 2.00    Types: Cigarettes  . Smokeless tobacco: Never Used  Substance Use Topics  . Alcohol use: Yes  Alcohol/week: 0.0 standard drinks    Comment: occ  . Drug use: No     Allergies   Alpha-gal and Other   Review of Systems Review of Systems  Eyes: Positive for pain and redness.  All other systems reviewed and are negative.    Physical Exam Updated Vital Signs BP 127/69 (BP Location: Right Arm)   Pulse 81   Temp (!) 97.5 F (36.4 C) (Oral)   Resp 17   Ht 5\' 7"  (1.702 m)   Wt 61.2 kg   LMP 02/24/2018   SpO2 100%   BMI 21.14 kg/m   Physical Exam Vitals signs and nursing note reviewed.  HENT:     Head: Normocephalic and atraumatic.  Eyes:     General: Lids are normal. Lids are everted, no foreign bodies appreciated. Vision grossly intact. Gaze aligned appropriately.        Right eye: No foreign body, discharge or hordeolum.     Intraocular pressure: Right eye pressure is 9 mmHg.     Extraocular Movements: Extraocular movements intact.     Conjunctiva/sclera:     Right eye: Right conjunctiva is injected. Chemosis present. No  exudate or hemorrhage.    Comments: Fluoroscein exam with Woods lamp does not reveal any uptake of the cornea  Neurological:     Mental Status: She is alert.      ED Treatments / Results  Labs (all labs ordered are listed, but only abnormal results are displayed) Labs Reviewed - No data to display  EKG None  Radiology No results found.  Procedures Procedures (including critical care time)  Medications Ordered in ED Medications  tetracaine (PONTOCAINE) 0.5 % ophthalmic solution 1 drop (has no administration in time range)  fluorescein ophthalmic strip 1 strip (has no administration in time range)  diphenhydrAMINE (BENADRYL) capsule 50 mg (has no administration in time range)  ketorolac (ACULAR) 0.5 % ophthalmic solution 1 drop (has no administration in time range)  gatifloxacin (ZYMAXID) 0.5 % ophthalmic drops 1 drop (has no administration in time range)     Initial Impression / Assessment and Plan / ED Course  I have reviewed the triage vital signs and the nursing notes.  Pertinent labs & imaging results that were available during my care of the patient were reviewed by me and considered in my medical decision making (see chart for details).     Patient presents with complaints of right eye pain that began a couple of hours ago.  Examination reveals an injected eye with some chemosis.  No foreign body noted.  Cornea is normal.  Anterior chamber appears normal.  Pressure is normal.  Patient does not have any visual change.  Treat for conjunctivitis, follow-up with ophthalmology tomorrow if no improvement.  Addendum: Nursing staff informs me that upon going to the room to discharge the patient she was no longer in the room.  Final Clinical Impressions(s) / ED Diagnoses   Final diagnoses:  Pain of right eye  Conjunctivitis of right eye, unspecified conjunctivitis type    ED Discharge Orders    None       Pollina, Canary Brimhristopher J, MD 02/25/18 [phone]    Gilda CreasePollina,  Christopher J, MD 02/26/18 [phone]

## 2018-02-25 NOTE — ED Triage Notes (Signed)
Pt c/o right eye pain with blurriness x 1 hour.

## 2018-02-26 ENCOUNTER — Telehealth: Payer: Self-pay | Admitting: Pediatrics

## 2018-02-26 MED ORDER — ERYTHROMYCIN 5 MG/GM OP OINT
TOPICAL_OINTMENT | OPHTHALMIC | Status: AC
  Filled 2018-02-26: qty 3.5

## 2018-02-26 NOTE — ED Notes (Signed)
Pt was not in the room when this nurse went in to discharge pt. Pt did not notify staff she was leaving.

## 2018-02-26 NOTE — Telephone Encounter (Signed)
Pt advised she would ntbs in office and offered appt today but pt declined and said she would call back.

## 2018-02-26 NOTE — Telephone Encounter (Signed)
What symptoms do you have? Pinkeye in the right eye. Went to [facility] and they stated that she left too early and she didn't get ABX. Patient called them back this morning and they advised her to call her PCP to get the ABX.  How long have you been sick? 02-25-18  Have you [facility] for this problem[facility]  If your provider decides to give you a prescription, which pharmacy would you like for it to be sent [facility]   Patient informed that this information will be sent to the clinical staff for review and that they should receive a follow up call.

## 2018-03-13 HISTORY — PX: ABDOMINAL HYSTERECTOMY: SHX81

## 2018-03-21 DIAGNOSIS — Z6821 Body mass index (BMI) 21.0-21.9, adult: Secondary | ICD-10-CM | POA: Diagnosis not present

## 2018-03-21 DIAGNOSIS — J029 Acute pharyngitis, unspecified: Secondary | ICD-10-CM | POA: Diagnosis not present

## 2018-03-21 DIAGNOSIS — R05 Cough: Secondary | ICD-10-CM | POA: Diagnosis not present

## 2018-03-21 DIAGNOSIS — J209 Acute bronchitis, unspecified: Secondary | ICD-10-CM | POA: Diagnosis not present

## 2018-04-23 ENCOUNTER — Telehealth: Payer: Self-pay | Admitting: Adult Health

## 2018-04-23 NOTE — Telephone Encounter (Signed)
Would like for a nurse to give her a call. She has some questions

## 2018-04-23 NOTE — Telephone Encounter (Signed)
Pt states that she has been on her period for 8 days, it is starting to lighten up now. She states that she is having a pain on her left side as well and asks if it could be an ovarian cyst. Advised that she would need to make an appt for evaluation by provider. Pt verbalized understanding.

## 2018-04-25 DIAGNOSIS — R109 Unspecified abdominal pain: Secondary | ICD-10-CM | POA: Diagnosis not present

## 2018-04-25 DIAGNOSIS — Z3202 Encounter for pregnancy test, result negative: Secondary | ICD-10-CM | POA: Diagnosis not present

## 2018-04-25 DIAGNOSIS — Z8742 Personal history of other diseases of the female genital tract: Secondary | ICD-10-CM | POA: Diagnosis not present

## 2018-04-25 DIAGNOSIS — N939 Abnormal uterine and vaginal bleeding, unspecified: Secondary | ICD-10-CM | POA: Diagnosis not present

## 2018-04-25 DIAGNOSIS — M549 Dorsalgia, unspecified: Secondary | ICD-10-CM | POA: Diagnosis not present

## 2018-05-01 ENCOUNTER — Encounter: Payer: Self-pay | Admitting: Advanced Practice Midwife

## 2018-05-01 ENCOUNTER — Ambulatory Visit: Payer: BLUE CROSS/BLUE SHIELD | Admitting: Advanced Practice Midwife

## 2018-05-01 VITALS — BP 110/73 | HR 91 | Ht 67.0 in | Wt 139.0 lb

## 2018-05-01 DIAGNOSIS — N926 Irregular menstruation, unspecified: Secondary | ICD-10-CM | POA: Diagnosis not present

## 2018-05-01 DIAGNOSIS — R1032 Left lower quadrant pain: Secondary | ICD-10-CM

## 2018-05-01 NOTE — Progress Notes (Signed)
[facility] Visit  Patient name: Courtney Bush MRN [id number]  Date of birth: 01/02/1996  CC & HPI:  REIGHAN DORSEY is a 23 y.o. Caucasian female presenting today for longer menstrual cycles and LLQ pain.  The past several cycles have been heavy and have lasted nearly 10 days whereas they have been 4-5 days long since 5th grade.  Has had LLQ pain for about a week, pain was so bad she went to urgent care a few days ago, nearly fainted with the pain.  Pain lessened after a few hours, managed w/naproxyn, but is still there. Not on BC, has factor 5 and didn't think she could be on anything. Denies fever/anorexia/nausea  Pertinent History Reviewed:  Medical & Surgical Hx:   Past Medical History:  Diagnosis Date  . Allergy    factor fived deficiency  . Asthma   . Blood clot in vein    ? question of clot in vein per the patient, no documentation to support DVT in [facility]  . Factor V deficiency (HCC)   . Factor V Leiden carrier (HCC)   . Migraine    Past Surgical History:  Procedure Laterality Date  . CHOLECYSTECTOMY N/A 10/05/2017   Procedure: LAPAROSCOPIC CHOLECYSTECTOMY;  Surgeon: Lucretia Roers, MD;  Location: [facility];  Service: General;  Laterality: N/A;  . TONSILLECTOMY    . VEIN SURGERY  12/2014  . WISDOM TOOTH EXTRACTION  2015   Family History  Problem Relation Age of Onset  . Hypertension Father   . Hyperlipidemia Father   . High Cholesterol Father   . Graves' disease Mother   . Factor V Leiden deficiency Maternal Grandmother   . Clotting disorder Maternal Grandmother   . Seizures Neg Hx     Current Outpatient Medications:  .  naproxen (NAPROSYN) 250 MG tablet, Take by mouth 2 (two) times daily with a meal., Disp: , Rfl:  .  EPINEPHrine (EPIPEN) 0.3 mg/0.3 mL SOAJ, Inject 0.3 mLs (0.3 mg total) into the muscle once. (Patient not taking: Reported on 05/01/2018), Disp: 1 Device, Rfl: 1 .  ibuprofen (ADVIL,MOTRIN) 200 MG tablet, Take 400 mg by mouth every  6 (six) hours as needed for moderate pain. , Disp: , Rfl:  Social History: Reviewed -  reports that she has been smoking cigarettes. She has a 2.00 pack-year smoking history. She has never used smokeless tobacco.  Review of Systems:   Constitutional: Negative for fever and chills Eyes: Negative for visual disturbances Respiratory: Negative for shortness of breath, dyspnea Cardiovascular: Negative for chest pain or palpitations  Gastrointestinal: Negative for vomiting, diarrhea and constipation Genitourinary: Negative for dysuria and urgency, vaginal irritation or itching Musculoskeletal: Negative for back pain, joint pain, myalgias  Neurological: Negative for dizziness and headaches    Objective Findings:    Physical Examination: Vitals:   05/01/18 1420  BP: 110/73  Pulse: 91   General appearance - well appearing, and in no distress Mental status - alert, oriented to person, place, and time Chest:  Normal respiratory effort Heart - normal rate and regular rhythm Abdomen:  Soft, nontender Pelvic: bimanual was difficult d/t pain.  RLQ/uterine manipulation caused LLQ pain.  Unable to assess LLQ d/t pain.  Musculoskeletal:  Normal range of motion without pain Extremities:  No edema    Results for orders placed or performed in visit on 05/01/18 (from the past 24 hour(s))  TSH   Collection Time: 05/01/18  3:10 PM  Result Value Ref Range  TSH 1.580 0.450 - 4.500 uIU/mL      Assessment & Plan:  A:   LLQ pain, ? Ov cyst  Longer perioeds P:  Pelvic US, go from there.  Discussed warning signs that would warrant immediate f/u  Return for asap for pelvic US and MD visit.  Scarlette Calico Cresenzo-Dishmon CNM 05/02/2018 9:07 AM

## 2018-05-02 LAB — TSH: TSH: 1.58 u[IU]/mL (ref 0.450–4.500)

## 2018-05-15 ENCOUNTER — Other Ambulatory Visit: Payer: Self-pay | Admitting: Advanced Practice Midwife

## 2018-05-15 DIAGNOSIS — R1032 Left lower quadrant pain: Secondary | ICD-10-CM

## 2018-05-16 ENCOUNTER — Ambulatory Visit (INDEPENDENT_AMBULATORY_CARE_PROVIDER_SITE_OTHER): Payer: BLUE CROSS/BLUE SHIELD

## 2018-05-16 ENCOUNTER — Encounter: Payer: Self-pay | Admitting: Obstetrics & Gynecology

## 2018-05-16 ENCOUNTER — Ambulatory Visit: Payer: BLUE CROSS/BLUE SHIELD | Admitting: Obstetrics & Gynecology

## 2018-05-16 ENCOUNTER — Other Ambulatory Visit: Payer: Self-pay

## 2018-05-16 VITALS — BP 124/84 | HR 75 | Ht 67.0 in | Wt 142.0 lb

## 2018-05-16 DIAGNOSIS — N938 Other specified abnormal uterine and vaginal bleeding: Secondary | ICD-10-CM | POA: Diagnosis not present

## 2018-05-16 DIAGNOSIS — R1032 Left lower quadrant pain: Secondary | ICD-10-CM

## 2018-05-16 MED ORDER — MEGESTROL ACETATE 40 MG PO TABS: ORAL_TABLET | ORAL | 3 refills | Status: DC

## 2018-05-16 NOTE — Progress Notes (Signed)
Follow up appointment for results  Chief Complaint  Patient presents with  . Follow-up    gyn u/s    Blood pressure 124/84, pulse 75, height 5\' 7"  (1.702 m), weight 142 lb (64.4 kg), last menstrual period 04/22/2018.  US Transvaginal Non-ob  Result Date: 05/16/2018 Courtney Bush is a 23 y.o. G0P0000 LMP 04/22/2018,she is here for a pelvic sonogram for LLQ pain with menorrhagia. Uterus                      6 x 3.8 x 5.8 cm, Total uterine volume 69 cc, homogeneous anteverted uterus,wnl Endometrium          2.7 mm, symmetrical, small amount of fluid w/in the endometrium Right ovary             3.5 x 2.3 x 3.3 cm, wnl Left ovary                2.3 x 3.4 x 1.7 cm, wnl small amount of simple cul de sac fluid Technician Comments: PELVIC US TA/TV: homogeneous anteverted uterus,wnl,EEC 2.7 mm,small amount of fluid w/in the endometrium,normal ovaries bilat,left adnexal pain during ultrasound,ovaries appear mobile,small amount of simple cul de sac fluid [employer] 05/16/2018 3:38 PM Clinical Impression and recommendations: I have reviewed the sonogram results above, combined with the patient's current clinical course, below are my impressions and any appropriate recommendations for management based on the sonographic findings. Uterus is normal with thin endometrium Both ovaries are normal Lazaro Arms 05/16/2018 3:47 PM   US Pelvis Complete  Result Date: 05/16/2018 Courtney SONOGRAM Courtney Bush is a 24 y.o. G0P0000 LMP 04/22/2018,she is here for a pelvic sonogram for LLQ pain with menorrhagia. Uterus                      6 x 3.8 x 5.8 cm, Total uterine volume 69 cc, homogeneous anteverted uterus,wnl Endometrium          2.7 mm, symmetrical, small amount of fluid w/in the endometrium Right ovary             3.5 x 2.3 x 3.3 cm, wnl Left ovary                2.3 x 3.4 x 1.7 cm, wnl small amount of simple cul de sac fluid Technician Comments: PELVIC US TA/TV: homogeneous anteverted  uterus,wnl,EEC 2.7 mm,small amount of fluid w/in the endometrium,normal ovaries bilat,left adnexal pain during ultrasound,ovaries appear mobile,small amount of simple cul de sac fluid [employer] 05/16/2018 3:38 PM Clinical Impression and recommendations: I have reviewed the sonogram results above, combined with the patient's current clinical course, below are my impressions and any appropriate recommendations for management based on the sonographic findings. Uterus is normal with thin endometrium Both ovaries are normal Lazaro Arms 05/16/2018 3:47 PM      MEDS ordered this encounter: No orders of the defined types were placed in this encounter.   Orders for this encounter: No orders of the defined types were placed in this encounter.   Impression: DUB (dysfunctional uterine bleeding)  Left lower quadrant pain    Plan: >normal sonogram >megestrol algorithm for endometrial resynchronization >after acute phase will discuss with patient no treatment vs management of cycles  Follow Up: Return in about 6 weeks (around 06/27/2018) for Follow up, with Dr Despina Hidden.       Face to face time:  15 minutes  Greater  than 50% of the visit time was spent in counseling and coordination of care with the patient.  The summary and outline of the counseling and care coordination is summarized in the note above.   All questions were answered.  Past Medical History:  Diagnosis Date  . Allergy    factor fived deficiency  . Asthma   . Blood clot in vein    ? question of clot in vein per the patient, no documentation to support DVT in [facility]  . Factor V deficiency (HCC)   . Factor V Leiden carrier (HCC)   . Migraine     Past Surgical History:  Procedure Laterality Date  . CHOLECYSTECTOMY N/A 10/05/2017   Procedure: LAPAROSCOPIC CHOLECYSTECTOMY;  Surgeon: Lucretia Roers, MD;  Location: [facility];  Service: General;  Laterality: N/A;  . TONSILLECTOMY    . VEIN SURGERY  12/2014  . WISDOM  TOOTH EXTRACTION  2015    OB History    Gravida  0   Para  0   Term  0   Preterm  0   AB  0   Living  0     SAB  0   TAB  0   Ectopic  0   Multiple  0   Live Births  0           Allergies  Allergen Reactions  . Alpha-Gal Anaphylaxis  . Other Hives and Rash    Tree nuts    Social History   Socioeconomic History  . Marital status: Married    Spouse name: Not on file  . Number of children: 0  . Years of education: Not on file  . Highest education level: Not on file  Occupational History  . Not on file  Social Needs  . Financial resource strain: Not on file  . Food insecurity:    Worry: Not on file    Inability: Not on file  . Transportation needs:    Medical: Not on file    Non-medical: Not on file  Tobacco Use  . Smoking status: Current Every Day Smoker    Packs/day: 0.50    Years: 4.00    Pack years: 2.00    Types: Cigarettes  . Smokeless tobacco: Never Used  Substance and Sexual Activity  . Alcohol use: Yes    Alcohol/week: 0.0 standard drinks    Comment: occ  . Drug use: No  . Sexual activity: Yes    Birth control/protection: Condom  Lifestyle  . Physical activity:    Days per week: Not on file    Minutes per session: Not on file  . Stress: Not on file  Relationships  . Social connections:    Talks on phone: Not on file    Gets together: Not on file    Attends religious service: Not on file    Active member of club or organization: Not on file    Attends meetings of clubs or organizations: Not on file    Relationship status: Not on file  Other Topics Concern  . Not on file  Social History Narrative  . Not on file    Family History  Problem Relation Age of Onset  . Hypertension Father   . Hyperlipidemia Father   . High Cholesterol Father   . Graves' disease Mother   . Factor V Leiden deficiency Maternal Grandmother   . Clotting disorder Maternal Grandmother   . Seizures Neg Hx

## 2018-05-16 NOTE — Progress Notes (Signed)
PELVIC US TA/TV: homogeneous anteverted uterus,wnl,EEC 2.7 mm,small amount of fluid w/in the endometrium,normal ovaries bilat,left adnexal pain during ultrasound,ovaries appear mobile,small amount of simple cul de sac fluid

## 2018-05-21 DIAGNOSIS — R102 Pelvic and perineal pain: Secondary | ICD-10-CM | POA: Diagnosis not present

## 2018-05-21 DIAGNOSIS — N938 Other specified abnormal uterine and vaginal bleeding: Secondary | ICD-10-CM | POA: Diagnosis not present

## 2018-05-21 DIAGNOSIS — Z6822 Body mass index (BMI) 22.0-22.9, adult: Secondary | ICD-10-CM | POA: Diagnosis not present

## 2018-06-21 IMAGING — US US ABDOMEN COMPLETE
1 series · 14 of 25 positions shown · non-contrast
Comparison: CT scan 11/21/2013

CLINICAL DATA: Epigastric and right upper quadrant pain

EXAM:
ABDOMEN ULTRASOUND COMPLETE

[Series 1: us abdomen complete · 0.15mm/px · 14 of 73 slices shown]
[im 1/73]
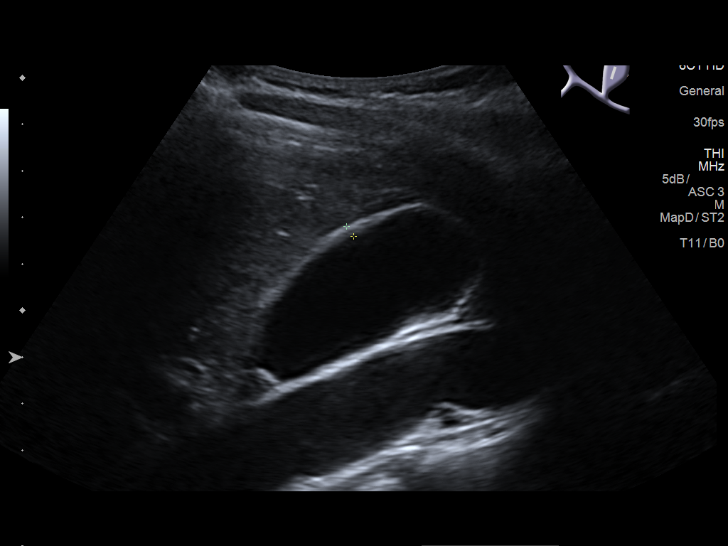
[im 7/73]
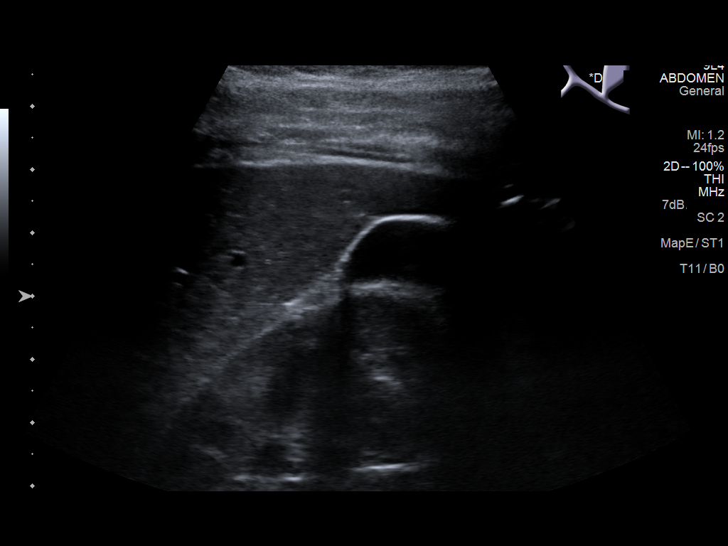
[im 13/73]
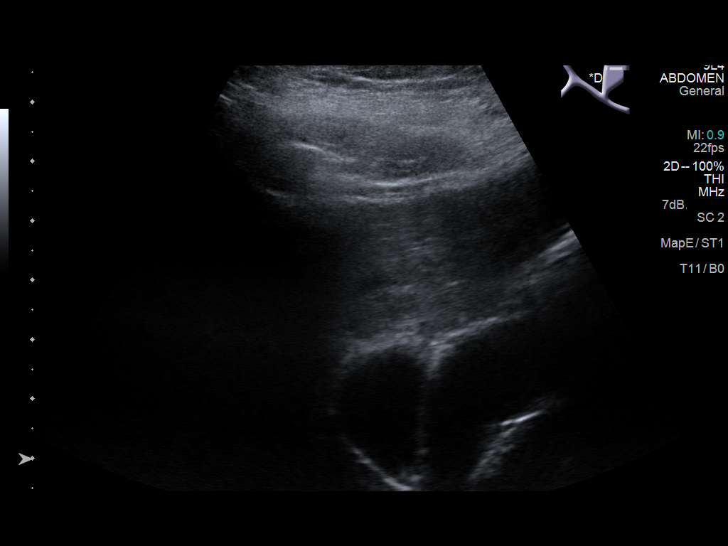
[im 19/73]
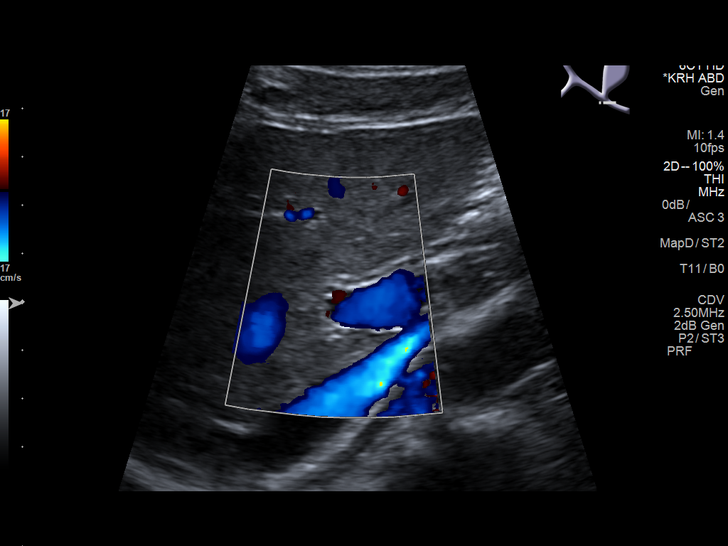
[im 25/73]
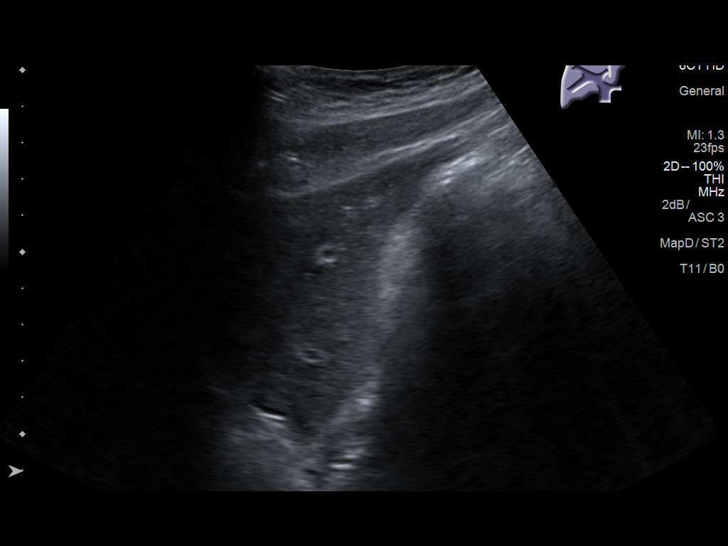
[im 28/73]
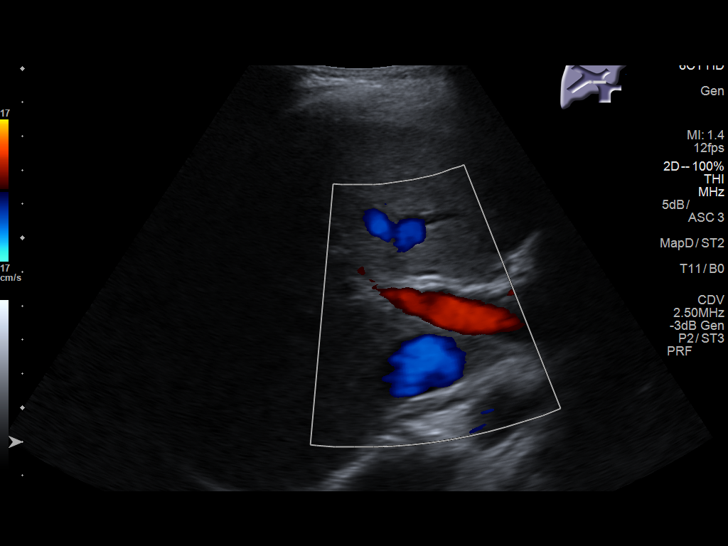
[im 34/73]
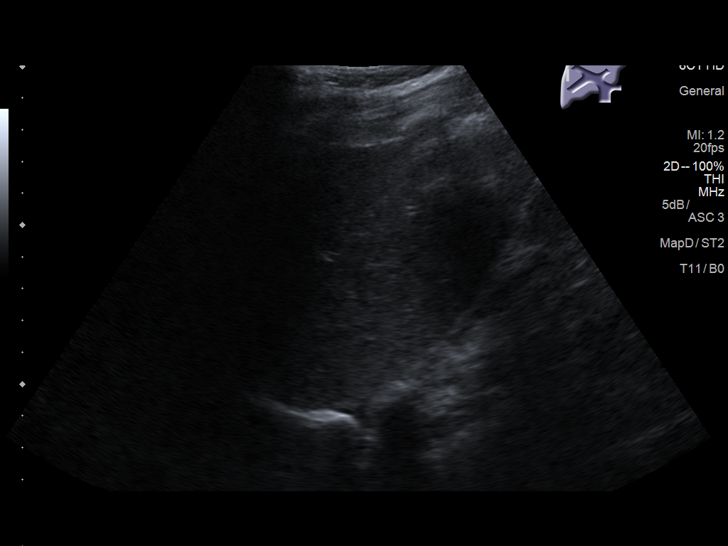
[im 40/73]
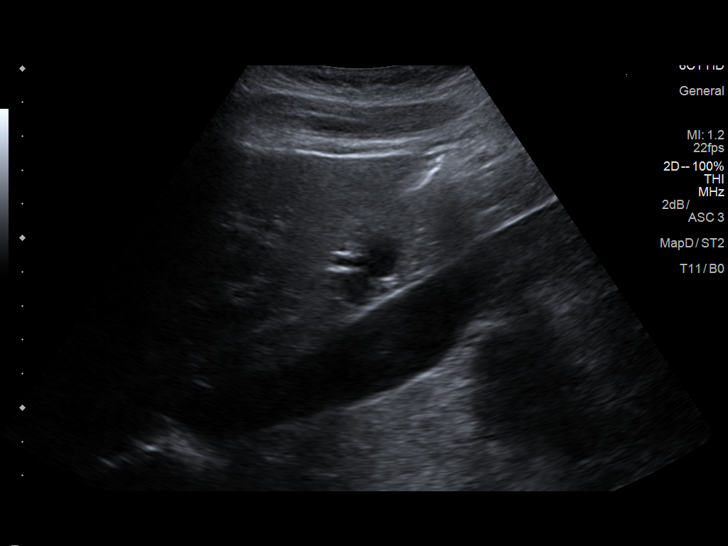
[im 46/73]
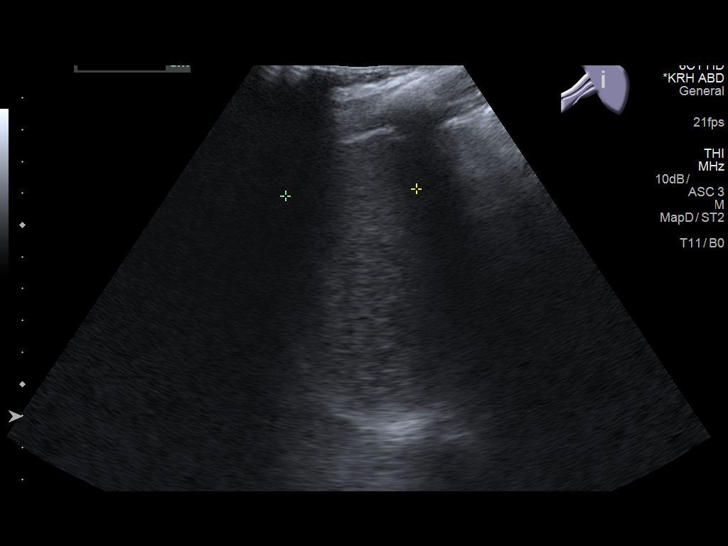
[im 49/73]
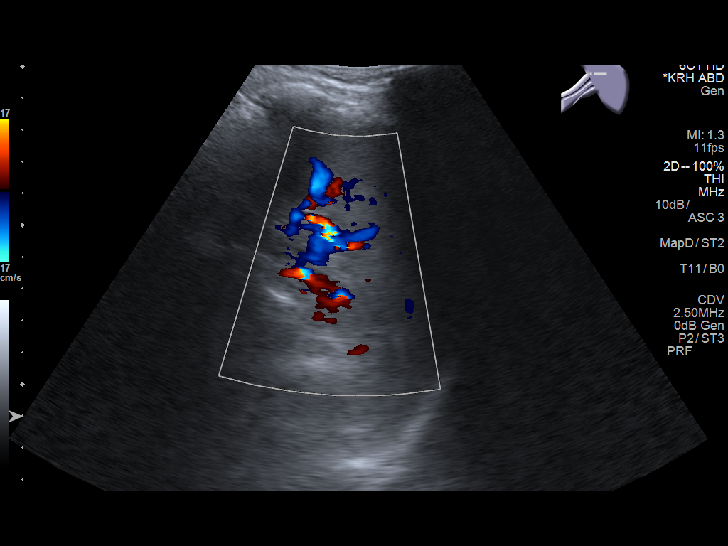
[im 55/73]
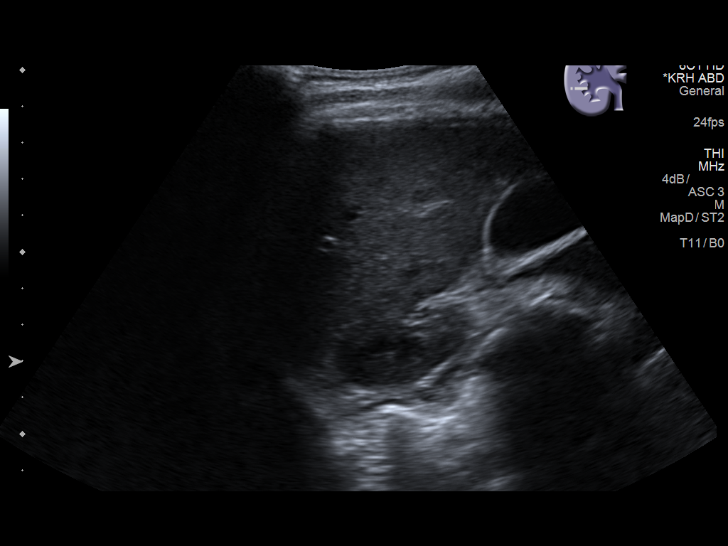
[im 61/73]
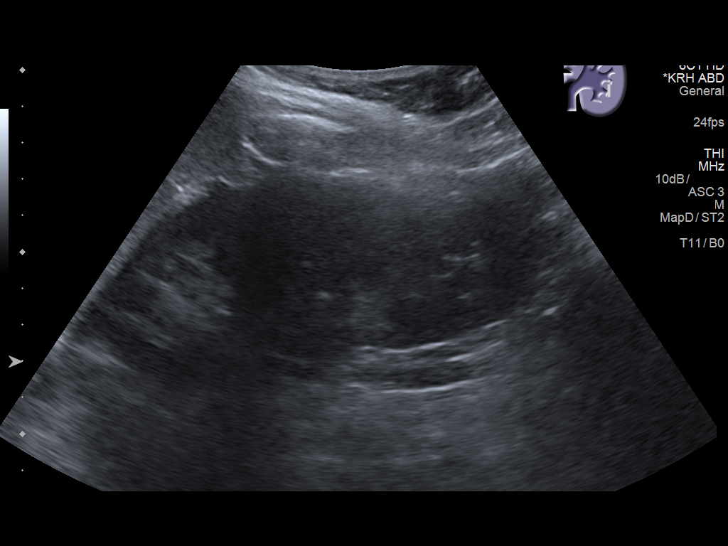
[im 67/73]
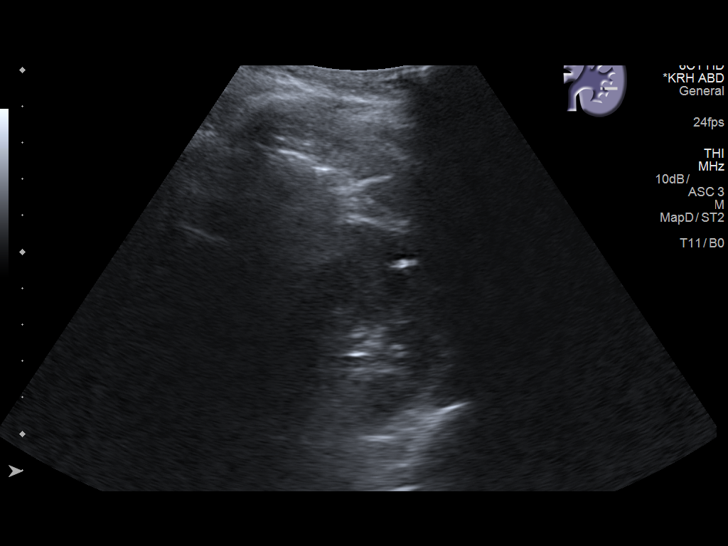
[im 73/73]
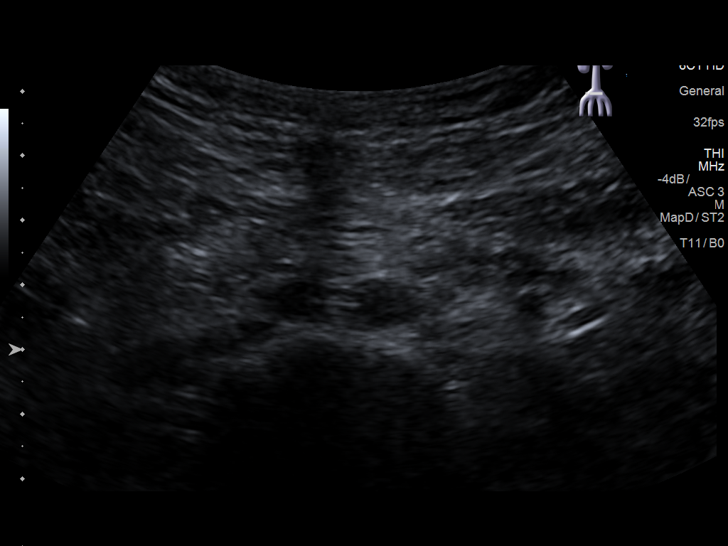

[14 of 25 positions shown; findings below may reference images not displayed]

FINDINGS: Gallbladder: No gallstones or gallbladder wall thickening. No
pericholecystic fluid. Sludge noted within the gallbladder lumen.
The sonographer reports no sonographic Murphy's sign.

Common bile duct: Diameter: 1 mm

Liver: No focal lesion identified. Within normal limits in
parenchymal echogenicity. Portal vein is patent on color Doppler
imaging with normal direction of blood flow towards the liver.

IVC: No abnormality visualized.

Pancreas: Visualized portion unremarkable.

Spleen: Size and appearance within normal limits.

Right Kidney: Length: 12.2 cm. Echogenicity within normal limits. No
mass or hydronephrosis visualized.

Left Kidney: Length: 12.1 cm. Echogenicity within normal limits. No
mass or hydronephrosis visualized.

Abdominal aorta: No aneurysm visualized.

Other findings: None.
IMPRESSION: Sludge in the gallbladder, otherwise normal abdominal ultrasound
exam.

## 2018-06-28 ENCOUNTER — Telehealth: Payer: Self-pay | Admitting: *Deleted

## 2018-07-01 ENCOUNTER — Ambulatory Visit: Payer: BLUE CROSS/BLUE SHIELD | Admitting: Adult Health

## 2018-07-02 NOTE — Telephone Encounter (Signed)
Opened in error

## 2018-07-31 DIAGNOSIS — R102 Pelvic and perineal pain: Secondary | ICD-10-CM | POA: Diagnosis not present

## 2018-07-31 DIAGNOSIS — Z6822 Body mass index (BMI) 22.0-22.9, adult: Secondary | ICD-10-CM | POA: Diagnosis not present

## 2018-08-08 DIAGNOSIS — Z6822 Body mass index (BMI) 22.0-22.9, adult: Secondary | ICD-10-CM | POA: Diagnosis not present

## 2018-08-08 DIAGNOSIS — R102 Pelvic and perineal pain: Secondary | ICD-10-CM | POA: Diagnosis not present

## 2018-08-08 DIAGNOSIS — D6851 Activated protein C resistance: Secondary | ICD-10-CM | POA: Diagnosis not present

## 2018-08-12 DIAGNOSIS — Z6824 Body mass index (BMI) 24.0-24.9, adult: Secondary | ICD-10-CM | POA: Diagnosis not present

## 2018-08-12 DIAGNOSIS — D6851 Activated protein C resistance: Secondary | ICD-10-CM | POA: Diagnosis not present

## 2018-08-26 DIAGNOSIS — N926 Irregular menstruation, unspecified: Secondary | ICD-10-CM | POA: Diagnosis not present

## 2018-08-26 DIAGNOSIS — Z720 Tobacco use: Secondary | ICD-10-CM | POA: Diagnosis not present

## 2018-08-26 DIAGNOSIS — D6851 Activated protein C resistance: Secondary | ICD-10-CM | POA: Diagnosis not present

## 2018-08-26 DIAGNOSIS — R102 Pelvic and perineal pain: Secondary | ICD-10-CM | POA: Diagnosis not present

## 2018-09-02 DIAGNOSIS — Z720 Tobacco use: Secondary | ICD-10-CM | POA: Insufficient documentation

## 2018-11-11 DIAGNOSIS — Z6824 Body mass index (BMI) 24.0-24.9, adult: Secondary | ICD-10-CM | POA: Diagnosis not present

## 2018-11-11 DIAGNOSIS — R102 Pelvic and perineal pain: Secondary | ICD-10-CM | POA: Diagnosis not present

## 2018-12-18 DIAGNOSIS — R102 Pelvic and perineal pain: Secondary | ICD-10-CM | POA: Diagnosis not present

## 2018-12-18 DIAGNOSIS — Z79899 Other long term (current) drug therapy: Secondary | ICD-10-CM | POA: Diagnosis not present

## 2018-12-18 DIAGNOSIS — J45909 Unspecified asthma, uncomplicated: Secondary | ICD-10-CM | POA: Diagnosis not present

## 2018-12-18 DIAGNOSIS — Z01818 Encounter for other preprocedural examination: Secondary | ICD-10-CM | POA: Diagnosis not present

## 2018-12-20 DIAGNOSIS — R102 Pelvic and perineal pain: Secondary | ICD-10-CM | POA: Diagnosis not present

## 2018-12-20 DIAGNOSIS — D6851 Activated protein C resistance: Secondary | ICD-10-CM | POA: Diagnosis not present

## 2018-12-20 DIAGNOSIS — J45909 Unspecified asthma, uncomplicated: Secondary | ICD-10-CM | POA: Diagnosis not present

## 2018-12-20 DIAGNOSIS — Z79899 Other long term (current) drug therapy: Secondary | ICD-10-CM | POA: Diagnosis not present

## 2018-12-30 DIAGNOSIS — Z6825 Body mass index (BMI) 25.0-25.9, adult: Secondary | ICD-10-CM | POA: Diagnosis not present

## 2018-12-30 DIAGNOSIS — N809 Endometriosis, unspecified: Secondary | ICD-10-CM | POA: Diagnosis not present

## 2019-01-14 DIAGNOSIS — N809 Endometriosis, unspecified: Secondary | ICD-10-CM | POA: Diagnosis not present

## 2019-01-14 DIAGNOSIS — Z6824 Body mass index (BMI) 24.0-24.9, adult: Secondary | ICD-10-CM | POA: Diagnosis not present

## 2019-01-16 DIAGNOSIS — R05 Cough: Secondary | ICD-10-CM | POA: Diagnosis not present

## 2019-01-24 DIAGNOSIS — N809 Endometriosis, unspecified: Secondary | ICD-10-CM | POA: Diagnosis not present

## 2019-01-24 DIAGNOSIS — Z01818 Encounter for other preprocedural examination: Secondary | ICD-10-CM | POA: Diagnosis not present

## 2019-01-24 DIAGNOSIS — Z01812 Encounter for preprocedural laboratory examination: Secondary | ICD-10-CM | POA: Diagnosis not present

## 2019-01-27 DIAGNOSIS — Z832 Family history of diseases of the blood and blood-forming organs and certain disorders involving the immune mechanism: Secondary | ICD-10-CM | POA: Diagnosis not present

## 2019-01-27 DIAGNOSIS — N8302 Follicular cyst of left ovary: Secondary | ICD-10-CM | POA: Diagnosis not present

## 2019-01-27 DIAGNOSIS — N99840 Postprocedural hematoma of a genitourinary system organ or structure following a genitourinary system procedure: Secondary | ICD-10-CM | POA: Diagnosis not present

## 2019-01-27 DIAGNOSIS — Z87891 Personal history of nicotine dependence: Secondary | ICD-10-CM | POA: Diagnosis not present

## 2019-01-27 DIAGNOSIS — N941 Unspecified dyspareunia: Secondary | ICD-10-CM | POA: Diagnosis not present

## 2019-01-27 DIAGNOSIS — D62 Acute posthemorrhagic anemia: Secondary | ICD-10-CM | POA: Diagnosis not present

## 2019-01-27 DIAGNOSIS — N8301 Follicular cyst of right ovary: Secondary | ICD-10-CM | POA: Diagnosis not present

## 2019-01-27 DIAGNOSIS — N803 Endometriosis of pelvic peritoneum: Secondary | ICD-10-CM | POA: Diagnosis not present

## 2019-01-27 DIAGNOSIS — J45909 Unspecified asthma, uncomplicated: Secondary | ICD-10-CM | POA: Diagnosis not present

## 2019-01-27 DIAGNOSIS — N809 Endometriosis, unspecified: Secondary | ICD-10-CM | POA: Diagnosis not present

## 2019-01-27 DIAGNOSIS — R102 Pelvic and perineal pain: Secondary | ICD-10-CM | POA: Diagnosis not present

## 2019-01-27 DIAGNOSIS — D682 Hereditary deficiency of other clotting factors: Secondary | ICD-10-CM | POA: Diagnosis not present

## 2019-01-27 DIAGNOSIS — N99820 Postprocedural hemorrhage and hematoma of a genitourinary system organ or structure following a genitourinary system procedure: Secondary | ICD-10-CM | POA: Diagnosis not present

## 2019-03-02 DIAGNOSIS — F17200 Nicotine dependence, unspecified, uncomplicated: Secondary | ICD-10-CM | POA: Diagnosis not present

## 2019-03-02 DIAGNOSIS — R1110 Vomiting, unspecified: Secondary | ICD-10-CM | POA: Diagnosis not present

## 2019-03-02 DIAGNOSIS — D6851 Activated protein C resistance: Secondary | ICD-10-CM | POA: Diagnosis not present

## 2019-03-02 DIAGNOSIS — R112 Nausea with vomiting, unspecified: Secondary | ICD-10-CM | POA: Diagnosis not present

## 2019-03-02 DIAGNOSIS — Z90710 Acquired absence of both cervix and uterus: Secondary | ICD-10-CM | POA: Diagnosis not present

## 2019-03-02 DIAGNOSIS — N760 Acute vaginitis: Secondary | ICD-10-CM | POA: Diagnosis not present

## 2019-06-24 ENCOUNTER — Ambulatory Visit: Payer: Self-pay | Admitting: General Surgery

## 2020-10-27 DIAGNOSIS — N809 Endometriosis, unspecified: Secondary | ICD-10-CM | POA: Insufficient documentation

## 2020-12-22 DIAGNOSIS — N959 Unspecified menopausal and perimenopausal disorder: Secondary | ICD-10-CM | POA: Diagnosis not present

## 2020-12-22 DIAGNOSIS — D6851 Activated protein C resistance: Secondary | ICD-10-CM | POA: Diagnosis not present

## 2021-08-19 DIAGNOSIS — R109 Unspecified abdominal pain: Secondary | ICD-10-CM | POA: Diagnosis not present

## 2021-08-19 DIAGNOSIS — Z6821 Body mass index (BMI) 21.0-21.9, adult: Secondary | ICD-10-CM | POA: Diagnosis not present

## 2021-11-03 DIAGNOSIS — Z90710 Acquired absence of both cervix and uterus: Secondary | ICD-10-CM | POA: Diagnosis not present

## 2021-11-03 DIAGNOSIS — Z90722 Acquired absence of ovaries, bilateral: Secondary | ICD-10-CM | POA: Diagnosis not present

## 2021-11-03 DIAGNOSIS — Z7901 Long term (current) use of anticoagulants: Secondary | ICD-10-CM | POA: Diagnosis not present

## 2021-11-03 DIAGNOSIS — R5383 Other fatigue: Secondary | ICD-10-CM | POA: Diagnosis not present

## 2021-11-03 DIAGNOSIS — D6851 Activated protein C resistance: Secondary | ICD-10-CM | POA: Diagnosis not present

## 2021-11-03 DIAGNOSIS — Z720 Tobacco use: Secondary | ICD-10-CM | POA: Diagnosis not present

## 2021-11-03 DIAGNOSIS — Z79899 Other long term (current) drug therapy: Secondary | ICD-10-CM | POA: Diagnosis not present

## 2022-01-09 DIAGNOSIS — J45909 Unspecified asthma, uncomplicated: Secondary | ICD-10-CM | POA: Diagnosis not present

## 2022-01-09 DIAGNOSIS — J329 Chronic sinusitis, unspecified: Secondary | ICD-10-CM | POA: Diagnosis not present

## 2022-01-09 DIAGNOSIS — J069 Acute upper respiratory infection, unspecified: Secondary | ICD-10-CM | POA: Diagnosis not present

## 2022-01-18 DIAGNOSIS — Z780 Asymptomatic menopausal state: Secondary | ICD-10-CM | POA: Diagnosis not present

## 2022-03-09 DIAGNOSIS — M5412 Radiculopathy, cervical region: Secondary | ICD-10-CM | POA: Diagnosis not present

## 2022-03-09 DIAGNOSIS — R0781 Pleurodynia: Secondary | ICD-10-CM | POA: Diagnosis not present

## 2022-03-09 DIAGNOSIS — M542 Cervicalgia: Secondary | ICD-10-CM | POA: Diagnosis not present

## 2022-03-09 DIAGNOSIS — R0789 Other chest pain: Secondary | ICD-10-CM | POA: Diagnosis not present

## 2022-03-14 DIAGNOSIS — M5412 Radiculopathy, cervical region: Secondary | ICD-10-CM | POA: Diagnosis not present

## 2022-03-14 DIAGNOSIS — M542 Cervicalgia: Secondary | ICD-10-CM | POA: Diagnosis not present

## 2022-03-17 DIAGNOSIS — M5412 Radiculopathy, cervical region: Secondary | ICD-10-CM | POA: Diagnosis not present

## 2022-06-19 DIAGNOSIS — J069 Acute upper respiratory infection, unspecified: Secondary | ICD-10-CM | POA: Diagnosis not present

## 2022-08-14 ENCOUNTER — Ambulatory Visit: Payer: BC Managed Care – PPO | Admitting: Nurse Practitioner

## 2022-08-14 ENCOUNTER — Encounter: Payer: Self-pay | Admitting: Nurse Practitioner

## 2022-08-14 VITALS — BP 96/67 | HR 84 | Temp 97.9°F | Ht 67.0 in | Wt 148.2 lb

## 2022-08-14 DIAGNOSIS — Z0001 Encounter for general adult medical examination with abnormal findings: Secondary | ICD-10-CM | POA: Diagnosis not present

## 2022-08-14 DIAGNOSIS — Z8249 Family history of ischemic heart disease and other diseases of the circulatory system: Secondary | ICD-10-CM | POA: Diagnosis not present

## 2022-08-14 DIAGNOSIS — J4520 Mild intermittent asthma, uncomplicated: Secondary | ICD-10-CM | POA: Diagnosis not present

## 2022-08-14 DIAGNOSIS — D6851 Activated protein C resistance: Secondary | ICD-10-CM | POA: Diagnosis not present

## 2022-08-14 DIAGNOSIS — I83891 Varicose veins of right lower extremities with other complications: Secondary | ICD-10-CM

## 2022-08-14 DIAGNOSIS — Z833 Family history of diabetes mellitus: Secondary | ICD-10-CM | POA: Insufficient documentation

## 2022-08-14 DIAGNOSIS — Z90710 Acquired absence of both cervix and uterus: Secondary | ICD-10-CM

## 2022-08-14 DIAGNOSIS — J302 Other seasonal allergic rhinitis: Secondary | ICD-10-CM

## 2022-08-14 DIAGNOSIS — F17200 Nicotine dependence, unspecified, uncomplicated: Secondary | ICD-10-CM

## 2022-08-14 DIAGNOSIS — J45909 Unspecified asthma, uncomplicated: Secondary | ICD-10-CM | POA: Insufficient documentation

## 2022-08-14 DIAGNOSIS — Z716 Tobacco abuse counseling: Secondary | ICD-10-CM | POA: Diagnosis not present

## 2022-08-14 DIAGNOSIS — Z7689 Persons encountering health services in other specified circumstances: Secondary | ICD-10-CM | POA: Insufficient documentation

## 2022-08-14 LAB — BAYER DCA HB A1C WAIVED: HB A1C (BAYER DCA - WAIVED): 4.9 % (ref 4.8–5.6)

## 2022-08-14 MED ORDER — CETIRIZINE HCL 10 MG PO TABS
10.0000 mg | ORAL_TABLET | Freq: Every day | ORAL | 1 refills | Status: AC
Start: 2022-08-14 — End: ?

## 2022-08-14 MED ORDER — ALBUTEROL SULFATE HFA 108 (90 BASE) MCG/ACT IN AERS
1.0000 | INHALATION_SPRAY | Freq: Four times a day (QID) | RESPIRATORY_TRACT | 1 refills | Status: AC | PRN
Start: 2022-08-14 — End: ?

## 2022-08-14 NOTE — Progress Notes (Addendum)
New Patient Office Visit  Subjective    Patient ID: Courtney Bush, female    DOB: 08/25/1995  Age: 27 y.o. MRN: [id number]  CC:  Chief Complaint  Patient presents with   Establish Care    HPI Courtney Bush  is a 27 yrs old female here today to establish care, and needs a referral for vascular. Concerns that she might be diabetic. She has PMH of Varicose veins, Factor V, and has not seen by vascular for over 3-yrs Per patient "normally I go to [facility] vascular in [place], since I have not better for so long they want a new referral. She had surgery in 2018 where some veins were removed on the right leg and develop a clot. She reports that for the last 3-weeks she has been dealing with increase pain with long standing " pain get to 7/10 with prolong standing and I can fel my leg throbbing". Denies chest pain, SOB  Reports history of asthma that is well controled with her inhaler. Last exacerbation was 3- yrs ago "it is more so associated to my allergy"  She has history of allergic rhinitis and currently on Zyrtec  Family history of diabetic and worry that she may have it. Denies polydipsia, polyuria, and polyphagia. We will check A1C today. Family history  of cardiac mother  Denies any issues with sleep, weight changes  Smokes 1/2 pack daily, started at 27 yrs old. She is not ready to quit. Explains to her that smoking causes stenosis, with her condition she should think about smoking cessation  No hcg done today, she had a full hysterectomy in 2020  Denies the use illicit drugs of illicit drugs, use ETOH socially  We will order labs today A1C:4.9  Outpatient Encounter Medications as of 08/14/2022  Medication Sig   EPINEPHrine (EPIPEN) 0.3 mg/0.3 mL SOAJ Inject 0.3 mLs (0.3 mg total) into the muscle once.   estradiol (ESTRACE) 0.1 MG/GM vaginal cream Small finger tip amount at bedtime   fluticasone (FLONASE) 50 MCG/ACT nasal spray Place into both nostrils daily.    ibuprofen (ADVIL,MOTRIN) 200 MG tablet Take 400 mg by mouth every 6 (six) hours as needed for moderate pain.    albuterol (VENTOLIN HFA) 108 (90 Base) MCG/ACT inhaler Inhale 1 puff into the lungs every 6 (six) hours as needed for wheezing or shortness of breath.   cetirizine (ZYRTEC) 10 MG tablet Take 1 tablet (10 mg total) by mouth daily.   [DISCONTINUED] albuterol (VENTOLIN HFA) 108 (90 Base) MCG/ACT inhaler Inhale into the lungs.   [DISCONTINUED] cetirizine (ZYRTEC) 10 MG tablet Take 1 tablet by mouth daily.   [DISCONTINUED] megestrol (MEGACE) 40 MG tablet 3 tablets a day for 5 days, 2 tablets a day for 5 days then 1 tablet daily (Patient not taking: Reported on 08/14/2022)   [DISCONTINUED] naproxen (NAPROSYN) 250 MG tablet Take by mouth 2 (two) times daily with a meal. (Patient not taking: Reported on 08/14/2022)   No facility-administered encounter medications on file as of 08/14/2022.    Past Medical History:  Diagnosis Date   Allergy    factor fived deficiency   Asthma    Blood clot in vein    ? question of clot in vein per the patient, no documentation to support DVT in [facility]   Current smoker    Factor V deficiency (HCC)    Factor V Leiden carrier (HCC)    Migraine     Past Surgical History:  Procedure  Laterality Date   ABDOMINAL HYSTERECTOMY  2020   total   CHOLECYSTECTOMY N/A 10/05/2017   Procedure: LAPAROSCOPIC CHOLECYSTECTOMY;  Surgeon: Lucretia Roers, MD;  Location: [facility];  Service: General;  Laterality: N/A;   TONSILLECTOMY     VEIN SURGERY  12/2014   WISDOM TOOTH EXTRACTION  2015    Family History  Problem Relation Age of Onset   Hypertension Father    Hyperlipidemia Father    High Cholesterol Father    Graves' disease Mother    Factor V Leiden deficiency Maternal Grandmother    Clotting disorder Maternal Grandmother    Seizures Neg Hx     Social History   Socioeconomic History   Marital status: Married    Spouse name: Not on file   Number of  children: 0   Years of education: Not on file   Highest education level: Not on file  Occupational History   Not on file  Tobacco Use   Smoking status: Every Day    Packs/day: 0.50    Years: 4.00    Additional pack years: 0.00    Total pack years: 2.00    Types: Cigarettes   Smokeless tobacco: Never  Vaping Use   Vaping Use: Former  Substance and Sexual Activity   Alcohol use: Yes    Alcohol/week: 0.0 standard drinks of alcohol    Comment: occ   Drug use: No   Sexual activity: Yes    Birth control/protection: Condom, Surgical    Comment: total hysterectomy  Other Topics Concern   Not on file  Social History Narrative   Not on file   Social Determinants of Health   Financial Resource Strain: Not on file  Food Insecurity: No Food Insecurity (08/14/2022)   Hunger Vital Sign    Worried About Running Out of Food in the Last Year: Never true    Ran Out of Food in the Last Year: Never true  Transportation Needs: Not on file  Physical Activity: Not on file  Stress: Not on file  Social Connections: Not on file  Intimate Partner Violence: Not At Risk (08/14/2022)   Humiliation, Afraid, Rape, and Kick questionnaire    Fear of Current or Ex-Partner: No    Emotionally Abused: No    Physically Abused: No    Sexually Abused: No    ROS Negative unless indicated in HPI   Objective    BP 96/67   Pulse 84   Temp 97.9 F (36.6 C) (Temporal)   Ht 5\' 7"  (1.702 m)   Wt 148 lb 3.2 oz (67.2 kg)   SpO2 97%   BMI 23.21 kg/m   Physical Exam Vitals and nursing note reviewed.  Constitutional:      Appearance: Normal appearance. She is normal weight.  HENT:     Head: Normocephalic and atraumatic.  Eyes:     General: No scleral icterus.    Extraocular Movements: Extraocular movements intact.     Conjunctiva/sclera: Conjunctivae normal.     Pupils: Pupils are equal, round, and reactive to light.  Neck:     Vascular: No carotid bruit.  Cardiovascular:     Rate and Rhythm: Normal  rate.     Heart sounds: Normal heart sounds, S1 normal and S2 normal. No murmur heard. Abdominal:     General: Bowel sounds are normal.     Palpations: There is no mass.     Tenderness: There is no abdominal tenderness.  Musculoskeletal:  General: Normal range of motion.     Cervical back: Normal range of motion and neck supple. No rigidity or tenderness.     Right lower leg: No tenderness. No edema.     Left lower leg: Normal. No tenderness. No edema.       Legs:     Comments: Varicose Veins  Skin:    General: Skin is warm and dry.     Coloration: Skin is not jaundiced.  Neurological:     General: No focal deficit present.     Mental Status: She is alert and oriented to person, place, and time. Mental status is at baseline.  Psychiatric:        Mood and Affect: Mood normal.        Behavior: Behavior normal.        Thought Content: Thought content normal.        Judgment: Judgment normal.       Assessment & Plan:  Encounter to establish care -     CMP14+EGFR -     CBC with Differential/Platelet -     Lipid panel -     TSH -     Bayer DCA Hb A1c Waived  Varicose veins of right lower extremity with complications -     CBC with Differential/Platelet -     Ambulatory referral to Vascular Surgery  Factor V Leiden (HCC) -     CBC with Differential/Platelet  Mild intermittent extrinsic asthma without complication -     Albuterol Sulfate HFA; Inhale 1 puff into the lungs every 6 (six) hours as needed for wheezing or shortness of breath.  Dispense: 18 g; Refill: 1  Family history of diabetes mellitus -     Ambulatory referral to Vascular Surgery  Family history of cardiac disorder in mother -     Lipid panel  Current smoker  Encounter for smoking cessation counseling -     Bayer DCA Hb A1c Waived  History of total hysterectomy  Seasonal allergic rhinitis, unspecified trigger -     Cetirizine HCl; Take 1 tablet (10 mg total) by mouth daily.  Dispense: 90  tablet; Refill: 1   Plan Varicose Veins: Referral to Vascular for varicose  - Asthma well controled continue Ventolin  1-2 puffs q8hrs.  Allergic Rhinitis Continue Zyrtec 10 mg 1-tab daily Labs ordered CBC, CMP, Lipid, TSH,  Smoking cessation: she was counseled not ready to quit  Plan of care discusses with client, follow up as scheduled or sooner if needed Return in about 6 months (around 02/13/2023) for physical. With EKG   Martina Sinner DNP

## 2022-08-15 LAB — CBC WITH DIFFERENTIAL/PLATELET
Basophils Absolute: 0 10*3/uL (ref 0.0–0.2)
Basos: 0 %
EOS (ABSOLUTE): 0.5 10*3/uL — ABNORMAL HIGH (ref 0.0–0.4)
Eos: 11 %
Hematocrit: 43.6 % (ref 34.0–46.6)
Hemoglobin: 14.3 g/dL (ref 11.1–15.9)
Immature Grans (Abs): 0 10*3/uL (ref 0.0–0.1)
Immature Granulocytes: 0 %
Lymphocytes Absolute: 1.8 10*3/uL (ref 0.7–3.1)
Lymphs: 36 %
MCH: 30.4 pg (ref 26.6–33.0)
MCHC: 32.8 g/dL (ref 31.5–35.7)
MCV: 93 fL (ref 79–97)
Monocytes Absolute: 0.4 10*3/uL (ref 0.1–0.9)
Monocytes: 8 %
Neutrophils Absolute: 2.2 10*3/uL (ref 1.4–7.0)
Neutrophils: 45 %
Platelets: 256 10*3/uL (ref 150–450)
RBC: 4.71 x10E6/uL (ref 3.77–5.28)
RDW: 12.5 % (ref 11.7–15.4)
WBC: 4.9 10*3/uL (ref 3.4–10.8)

## 2022-08-15 LAB — LIPID PANEL
Chol/HDL Ratio: 3 ratio (ref 0.0–4.4)
Cholesterol, Total: 199 mg/dL (ref 100–199)
HDL: 67 mg/dL (ref >39)
LDL Chol Calc (NIH): 122 mg/dL — ABNORMAL HIGH (ref 0–99)
Triglycerides: 57 mg/dL (ref 0–149)
VLDL Cholesterol Cal: 10 mg/dL (ref 5–40)

## 2022-08-15 LAB — CMP14+EGFR
ALT: 54 IU/L — ABNORMAL HIGH (ref 0–32)
AST: 33 IU/L (ref 0–40)
Albumin/Globulin Ratio: 2.1 (ref 1.2–2.2)
Albumin: 5 g/dL (ref 4.0–5.0)
Alkaline Phosphatase: 68 IU/L (ref 44–121)
BUN/Creatinine Ratio: 16 (ref 9–23)
BUN: 14 mg/dL (ref 6–20)
Bilirubin Total: 0.4 mg/dL (ref 0.0–1.2)
CO2: 24 mmol/L (ref 20–29)
Calcium: 10.2 mg/dL (ref 8.7–10.2)
Chloride: 103 mmol/L (ref 96–106)
Creatinine, Ser: 0.88 mg/dL (ref 0.57–1.00)
Globulin, Total: 2.4 g/dL (ref 1.5–4.5)
Glucose: 82 mg/dL (ref 70–99)
Potassium: 4.7 mmol/L (ref 3.5–5.2)
Sodium: 141 mmol/L (ref 134–144)
Total Protein: 7.4 g/dL (ref 6.0–8.5)
eGFR: 93 mL/min/{1.73_m2} (ref >59)

## 2022-08-15 LAB — TSH: TSH: 1.18 u[IU]/mL (ref 0.450–4.500)

## 2022-08-15 NOTE — Progress Notes (Signed)
Lipid panel revealed a normal HDL (good cholesterol that protects against stroke and heart attack).  LDL was is high (bad cholesterol that can put you at increased risk of stroke and heart attack).  Triglycerides were normal (this level correlates with how much sugar/ carbohydrates you eat/drink).  To maintain healthy levels patient should continue to work on diet and exercise.  Eat plenty of fruits and vegetables.  Avoid saturated fats and high carbohydrate foods.  Get at least 30 minutes of exercise daily. A cholesterol medication is not recommended at this time.  Diet encouraged - increase intake of fresh fruits and vegetables, increase intake of lean proteins. Bake, broil, or grill foods. Avoid fried, greasy, and fatty foods. Avoid fast foods. Increase intake of fiber-rich whole grains. Exercise encouraged - at least 150 minutes per week and advance as tolerated. Follow up  as discussed.   Liver function AST is high, we will continue to monitor it

## 2022-08-24 ENCOUNTER — Ambulatory Visit (INDEPENDENT_AMBULATORY_CARE_PROVIDER_SITE_OTHER): Payer: BC Managed Care – PPO | Admitting: Nurse Practitioner

## 2022-08-24 ENCOUNTER — Encounter: Payer: Self-pay | Admitting: Nurse Practitioner

## 2022-08-24 VITALS — BP 103/73 | HR 85 | Temp 97.9°F | Ht 67.0 in | Wt 147.2 lb

## 2022-08-24 DIAGNOSIS — Z0001 Encounter for general adult medical examination with abnormal findings: Secondary | ICD-10-CM | POA: Diagnosis not present

## 2022-08-24 DIAGNOSIS — F17209 Nicotine dependence, unspecified, with unspecified nicotine-induced disorders: Secondary | ICD-10-CM | POA: Insufficient documentation

## 2022-08-24 DIAGNOSIS — Z0000 Encounter for general adult medical examination without abnormal findings: Secondary | ICD-10-CM

## 2022-08-24 NOTE — Progress Notes (Signed)
Complete physical exam  Patient: Courtney Bush   DOB: 09-05-1995   26 y.o. Female  MRN: [id number]  Subjective:    Chief Complaint  Patient presents with   Annual Exam    Physical     Chianne Best Holladay is a 27 y.o. female who presents today for a complete physical exam, and wants to discuss her recent lab results. She gets her PAP done her GYN office. She reports consuming a low fat and low sodium diet. Home exercise routine includes walking 0.5 hrs per day. She generally feels well. She reports sleeping well. She does not have additional problems to discuss today.   Migraine I well controlled with ibuprofen  Family history of breast cancer, mom mid 61, maternal grandmother, maternal aunt before 40 yrs. Discuss the importance of monthly self breast exam and will start screening mammogram at 27 yrs old.  She is actively trying of quit smoking "I a down to 3-cigarettes" Last Pap Dec 2023 repeat Had full hysterectomy  Most recent fall risk assessment: No recent falls     05/01/2018    2:24 PM  Fall Risk   Falls in the past year? 0  Number falls in past yr: 0  Injury with Fall? 0     Most recent depression screenings:    08/24/2022    8:16 AM 08/14/2022    8:20 AM  PHQ 2/9 Scores  PHQ - 2 Score 0 0  PHQ- 9 Score 0 1    Vision:Within last year and STD: The patient denies history of sexually transmitted disease.  Patient Active Problem List   Diagnosis Date Noted   Tobacco use disorder, continuous 08/24/2022   Encounter for annual physical examination excluding gynecological examination in a patient older than 17 years 08/24/2022   Encounter to establish care 08/14/2022   Allergic asthma 08/14/2022   Family history of diabetes mellitus 08/14/2022   Family history of cardiac disorder in mother 08/14/2022   Encounter for smoking cessation counseling 08/14/2022   Current smoker 08/14/2022   History of total hysterectomy 08/14/2022   Endometriosis 10/27/2020    Tobacco use 09/02/2018   Gallbladder sludge 10/02/2017   Cervical lymphadenopathy 01/23/2017   Factor V Leiden (HCC) 01/17/2017   Allergy to alpha-gal 01/17/2017   Varicose veins of right lower extremity with complications 09/06/2015   Varicose veins of right lower extremity 12/28/2014   Migraine 12/03/2014   Tree nut allergy 12/03/2014   Allergic rhinitis 12/03/2014   Varicose veins of leg with complications 10/27/2014   Past Medical History:  Diagnosis Date   Allergy    factor fived deficiency   Asthma    Blood clot in vein    ? question of clot in vein per the patient, no documentation to support DVT in [facility]   Current smoker    Factor V deficiency (HCC)    Factor V Leiden carrier (HCC)    Migraine    Social History   Tobacco Use   Smoking status: Every Day    Packs/day: 0.50    Years: 4.00    Additional pack years: 0.00    Total pack years: 2.00    Types: Cigarettes   Smokeless tobacco: Never  Vaping Use   Vaping Use: Former  Substance Use Topics   Alcohol use: Yes    Alcohol/week: 0.0 standard drinks of alcohol    Comment: occ   Drug use: No   Family History  Problem Relation Age of Onset  Hypertension Father    Hyperlipidemia Father    High Cholesterol Father    Graves' disease Mother    Factor V Leiden deficiency Maternal Grandmother    Clotting disorder Maternal Grandmother    Seizures Neg Hx       Patient Care Team: Evern Bio, Dois Davenport, NP as PCP - General (Nurse Practitioner)   Outpatient Medications Prior to Visit  Medication Sig   albuterol (VENTOLIN HFA) 108 (90 Base) MCG/ACT inhaler Inhale 1 puff into the lungs every 6 (six) hours as needed for wheezing or shortness of breath.   cetirizine (ZYRTEC) 10 MG tablet Take 1 tablet (10 mg total) by mouth daily.   EPINEPHrine (EPIPEN) 0.3 mg/0.3 mL SOAJ Inject 0.3 mLs (0.3 mg total) into the muscle once.   estradiol (ESTRACE) 0.1 MG/GM vaginal cream Small finger tip amount at bedtime    fluticasone (FLONASE) 50 MCG/ACT nasal spray Place into both nostrils daily.   ibuprofen (ADVIL,MOTRIN) 200 MG tablet Take 400 mg by mouth every 6 (six) hours as needed for moderate pain.    No facility-administered medications prior to visit.    ROS Negative unless indicated in HPI    Objective:     BP 103/73   Pulse 85   Temp 97.9 F (36.6 C) (Temporal)   Ht 5\' 7"  (1.702 m)   Wt 147 lb 3.2 oz (66.8 kg)   SpO2 95%   BMI 23.05 kg/m  BP Readings from Last 3 Encounters:  08/24/22 103/73  08/14/22 96/67  05/16/18 124/84   Wt Readings from Last 3 Encounters:  08/24/22 147 lb 3.2 oz (66.8 kg)  08/14/22 148 lb 3.2 oz (67.2 kg)  05/16/18 142 lb (64.4 kg)      Physical Exam Vitals and nursing note reviewed.  Constitutional:      Appearance: Normal appearance. She is normal weight.  HENT:     Head: Normocephalic and atraumatic.  Cardiovascular:     Rate and Rhythm: Normal rate.     Heart sounds: Normal heart sounds. No murmur heard. Pulmonary:     Effort: Pulmonary effort is normal. No respiratory distress.     Breath sounds: Normal breath sounds.  Abdominal:     General: Abdomen is flat. Bowel sounds are normal. There is no distension.     Palpations: Abdomen is soft. There is no mass.     Tenderness: There is no right CVA tenderness or left CVA tenderness.  Musculoskeletal:        General: Normal range of motion.     Cervical back: Normal range of motion and neck supple. No rigidity or tenderness.     Right lower leg: No edema.     Left lower leg: No edema.  Skin:    General: Skin is warm and dry.     Findings: No rash.  Neurological:     General: No focal deficit present.     Mental Status: She is alert and oriented to person, place, and time. Mental status is at baseline.  Psychiatric:        Mood and Affect: Mood normal.        Thought Content: Thought content normal.        Judgment: Judgment normal.      No results found for any visits on  08/24/22. Last CBC Lab Results  Component Value Date   WBC 4.9 08/14/2022   HGB 14.3 08/14/2022   HCT 43.6 08/14/2022   MCV 93 08/14/2022   MCH 30.4  08/14/2022   RDW 12.5 08/14/2022   PLT 256 08/14/2022   Last metabolic panel Lab Results  Component Value Date   GLUCOSE 82 08/14/2022   NA 141 08/14/2022   K 4.7 08/14/2022   CL 103 08/14/2022   CO2 24 08/14/2022   BUN 14 08/14/2022   CREATININE 0.88 08/14/2022   EGFR 93 08/14/2022   CALCIUM 10.2 08/14/2022   PROT 7.4 08/14/2022   ALBUMIN 5.0 08/14/2022   LABGLOB 2.4 08/14/2022   AGRATIO 2.1 08/14/2022   BILITOT 0.4 08/14/2022   ALKPHOS 68 08/14/2022   AST 33 08/14/2022   ALT 54 (H) 08/14/2022   ANIONGAP 6 09/11/2017   Last lipids Lab Results  Component Value Date   CHOL 199 08/14/2022   HDL 67 08/14/2022   LDLCALC 122 (H) 08/14/2022   TRIG 57 08/14/2022   CHOLHDL 3.0 08/14/2022   Last thyroid functions Lab Results  Component Value Date   TSH 1.180 08/14/2022   T4TOTAL 8.7 02/14/2017        Assessment & Plan:    Routine Health Maintenance and Physical Exam  Discussed health benefits of physical activity, and encouraged her to engage in regular exercise appropriate for her age and condition.  Tobacco use disorder, continuous  Encounter for annual physical examination excluding gynecological examination in a patient older than 17 years   Annual Physical  No labs needed today, she had labs done recently Migraine well controlled Family history of breast cancer (discuss the importance of monthly self breast exam) - smoking cessation discussed, she wants to do it without med  - discuss diet and exercise walking 3-4 x15-30 mins weekly - will repeat Lipid at her next f/u [facility] discuss with the client, al questions answered  Return in about 6 months (around 02/23/2023) for follow-up for Migraine and repeat Lipid.    [address] Santa Lighter DNP

## 2022-08-31 ENCOUNTER — Other Ambulatory Visit: Payer: Self-pay | Admitting: *Deleted

## 2022-08-31 DIAGNOSIS — I83899 Varicose veins of unspecified lower extremities with other complications: Secondary | ICD-10-CM

## 2022-09-11 NOTE — Progress Notes (Unsigned)
[facility] [place]  ASSESSMENT / PLAN: Courtney Bush is a 27 y.o. female with chronic venous insufficiency of right > left lower extremity causing reticular veins about the popliteal fossa Venous duplex is significant for deep venous reflux. No saphenous vein remains that is refluxing. Recommend compression and elevation for symptomatic relief. The patient is anxious about CVI. She prefers yearly surveillance. I will see her again in a year with a venous duplex.   CHIEF COMPLAINT: swollen vein in right popliteal fossa  HISTORY OF PRESENT ILLNESS: Courtney Bush is a 27 y.o. female with history of chronic venous insufficiency managed by endovenous intervention with Dr. Hart Rochester 5 years ago.  Patient has mild swelling in the right leg which is somewhat bothersome to her.  She is also noticed a palpable "knot" behind her right knee.  She has noticed superficial veins which are prominent behind her right knee.  She presents to discuss options going forward.  Past Medical History:  Diagnosis Date   Allergy    factor fived deficiency   Asthma    Blood clot in vein    ? question of clot in vein per the patient, no documentation to support DVT in [facility]   Current smoker    Factor V deficiency (HCC)    Factor V Leiden carrier (HCC)    Migraine     Past Surgical History:  Procedure Laterality Date   ABDOMINAL HYSTERECTOMY  2020   total   CHOLECYSTECTOMY N/A 10/05/2017   Procedure: LAPAROSCOPIC CHOLECYSTECTOMY;  Surgeon: Lucretia Roers, MD;  Location: [facility];  Service: General;  Laterality: N/A;   TONSILLECTOMY     VEIN SURGERY  12/2014   WISDOM TOOTH EXTRACTION  2015    Family History  Problem Relation Age of Onset   Hypertension Father    Hyperlipidemia Father    High Cholesterol Father    Graves' disease Mother    Factor V Leiden deficiency Maternal Grandmother    Clotting disorder Maternal Grandmother    Seizures Neg Hx     Social  History   Socioeconomic History   Marital status: Married    Spouse name: Not on file   Number of children: 0   Years of education: Not on file   Highest education level: Not on file  Occupational History   Not on file  Tobacco Use   Smoking status: Every Day    Packs/day: 0.50    Years: 4.00    Additional pack years: 0.00    Total pack years: 2.00    Types: Cigarettes   Smokeless tobacco: Never  Vaping Use   Vaping Use: Former  Substance and Sexual Activity   Alcohol use: Yes    Alcohol/week: 0.0 standard drinks of alcohol    Comment: occ   Drug use: No   Sexual activity: Yes    Birth control/protection: Condom, Surgical    Comment: total hysterectomy  Other Topics Concern   Not on file  Social History Narrative   Not on file   Social Determinants of Health   Financial Resource Strain: Not on file  Food Insecurity: No Food Insecurity (08/14/2022)   Hunger Vital Sign    Worried About Running Out of Food in the Last Year: Never true    Ran Out of Food in the Last Year: Never true  Transportation Needs: Not on file  Physical Activity: Not on file  Stress: Not on file  Social Connections: Not on file  Intimate Partner Violence: Not At Risk (08/14/2022)   Humiliation, Afraid, Rape, and Kick questionnaire    Fear of Current or Ex-Partner: No    Emotionally Abused: No    Physically Abused: No    Sexually Abused: No    Allergies  Allergen Reactions   Alpha-Gal Anaphylaxis   Other Hives and Rash    Tree nuts    Current Outpatient Medications  Medication Sig Dispense Refill   albuterol (VENTOLIN HFA) 108 (90 Base) MCG/ACT inhaler Inhale 1 puff into the lungs every 6 (six) hours as needed for wheezing or shortness of breath. 18 g 1   cetirizine (ZYRTEC) 10 MG tablet Take 1 tablet (10 mg total) by mouth daily. 90 tablet 1   EPINEPHrine (EPIPEN) 0.3 mg/0.3 mL SOAJ Inject 0.3 mLs (0.3 mg total) into the muscle once. 1 Device 1   estradiol (ESTRACE) 0.1 MG/GM vaginal  cream Small finger tip amount at bedtime     fluticasone (FLONASE) 50 MCG/ACT nasal spray Place into both nostrils daily.     ibuprofen (ADVIL,MOTRIN) 200 MG tablet Take 400 mg by mouth every 6 (six) hours as needed for moderate pain.      No current facility-administered medications for this visit.    PHYSICAL EXAM Vitals:   09/12/22 1500  BP: 96/67  Pulse: 84  Resp: 20  Temp: 97.8 F (36.6 C)  SpO2: 96%  Weight: 147 lb (66.7 kg)  Height: 5\' 7"  (1.702 m)    Well-appearing young woman in no acute distress Regular rate and rhythm Unlabored breathing Thrombophlebitis behind the right knee in the popliteal fossa.  There are significant reticular veins in the space as well.  No other significant varicosities or reticular veins seen.  The right ankle is mildly edematous as compared to the left.  There are palpable dorsalis pedis pulses bilaterally   PERTINENT LABORATORY AND RADIOLOGIC DATA  Most recent CBC    Latest Ref Rng & Units 08/14/2022    8:51 AM 09/11/2017    9:15 AM 02/13/2017    2:15 PM  CBC  WBC 3.4 - 10.8 x10E3/uL 4.9  4.9  5.6   Hemoglobin 11.1 - 15.9 g/dL 16.1  09.6  04.5   Hematocrit 34.0 - 46.6 % 43.6  43.8  40.3   Platelets 150 - 450 x10E3/uL 256  260  290      Most recent CMP    Latest Ref Rng & Units 08/14/2022    8:51 AM 09/11/2017    9:15 AM 02/13/2017    2:15 PM  CMP  Glucose 70 - 99 mg/dL 82  88  95   BUN 6 - 20 mg/dL 14  9  13    Creatinine 0.57 - 1.00 mg/dL 4.09  8.11  9.14   Sodium 134 - 144 mmol/L 141  142  138   Potassium 3.5 - 5.2 mmol/L 4.7  4.6  4.0   Chloride 96 - 106 mmol/L 103  108  105   CO2 20 - 29 mmol/L 24  28  27    Calcium 8.7 - 10.2 mg/dL 78.2  9.5  9.3   Total Protein 6.0 - 8.5 g/dL 7.4  7.6    Total Bilirubin 0.0 - 1.2 mg/dL 0.4  0.8    Alkaline Phos 44 - 121 IU/L 68  40    AST 0 - 40 IU/L 33  20    ALT 0 - 32 IU/L 54  16      Renal function CrCl cannot be  calculated (Patient's most recent lab result is older than the maximum  21 days allowed.).  HB A1C (BAYER DCA - WAIVED) (%)  Date Value  08/14/2022 4.9    LDL Chol Calc (NIH)  Date Value Ref Range Status  08/14/2022 122 (H) 0 - 99 mg/dL Final    Rande Brunt. Lenell Antu, MD [facility] [place] [facility] Phone Number: [phone] 09/12/2022 4:12 PM   Total time spent on preparing this encounter including chart review, data review, collecting history, examining the patient, coordinating care for this new patient, 45 minutes.  Portions of this report may have been transcribed using voice recognition software.  Every effort has been made to ensure accuracy; however, inadvertent computerized transcription errors may still be present.

## 2022-09-12 ENCOUNTER — Ambulatory Visit (HOSPITAL_COMMUNITY)
Admission: RE | Admit: 2022-09-12 | Discharge: 2022-09-12 | Disposition: A | Discharge status: Home / Self Care | Payer: BC Managed Care – PPO | Source: Ambulatory Visit | Attending: Vascular Surgery | Admitting: Vascular Surgery

## 2022-09-12 ENCOUNTER — Ambulatory Visit: Payer: BC Managed Care – PPO | Admitting: Vascular Surgery

## 2022-09-12 ENCOUNTER — Encounter: Payer: Self-pay | Admitting: Vascular Surgery

## 2022-09-12 VITALS — BP 96/67 | HR 84 | Temp 97.8°F | Resp 20 | Ht 67.0 in | Wt 147.0 lb

## 2022-09-12 DIAGNOSIS — I83899 Varicose veins of unspecified lower extremities with other complications: Secondary | ICD-10-CM | POA: Insufficient documentation

## 2022-09-12 DIAGNOSIS — I872 Venous insufficiency (chronic) (peripheral): Secondary | ICD-10-CM | POA: Diagnosis not present

## 2022-09-12 DIAGNOSIS — I83891 Varicose veins of right lower extremities with other complications: Secondary | ICD-10-CM

## 2022-09-12 DIAGNOSIS — I8393 Asymptomatic varicose veins of bilateral lower extremities: Secondary | ICD-10-CM

## 2022-09-17 ENCOUNTER — Encounter: Payer: Self-pay | Admitting: Vascular Surgery

## 2022-09-18 ENCOUNTER — Telehealth: Payer: Self-pay | Admitting: Nurse Practitioner

## 2022-09-18 NOTE — Telephone Encounter (Signed)
Pt does not have form yet. Once she has forms she will bring to office.

## 2022-09-18 NOTE — Telephone Encounter (Signed)
Pt wants to speak to PCP regarding referral to [facility] in [place] at [facility].   Pt says she is wearing the compression stockings but says at work she has to sit in high chairs and she isnt able to elevate her legs at all. Says her job is trying to get her in a different dept but needs PCP to fill out ADA form needs to be filled out per her HR Dept so that she can transfer departments.

## 2022-09-19 ENCOUNTER — Encounter: Payer: Self-pay | Admitting: Nurse Practitioner

## 2022-09-19 ENCOUNTER — Ambulatory Visit: Payer: BC Managed Care – PPO | Admitting: Nurse Practitioner

## 2022-09-19 VITALS — BP 106/70 | HR 71 | Temp 97.8°F | Ht 67.0 in | Wt 147.8 lb

## 2022-09-19 DIAGNOSIS — I872 Venous insufficiency (chronic) (peripheral): Secondary | ICD-10-CM

## 2022-09-19 DIAGNOSIS — D6851 Activated protein C resistance: Secondary | ICD-10-CM

## 2022-09-19 DIAGNOSIS — I83899 Varicose veins of unspecified lower extremities with other complications: Secondary | ICD-10-CM

## 2022-09-19 NOTE — Progress Notes (Signed)
Established Patient Office Visit  Subjective   Patient ID: Courtney Bush, female    DOB: 1995-10-12  Age: 27 y.o. MRN: [id number]  Chief Complaint  Patient presents with   need for referral    Needs referral to hemotologist. Would like to go to [facility] cancer center at [facility].     HPI Courtney Bush is 27 yrs old female seen today as a an acute visit for FMLA paperwork and referral to hematology. She was referred to vascular at her last visit and reports that she was told that she has a superficial DVT that is caused by her Factor V and that factor V. She was under the care hematology, but the provider retired and need a new referral. She also brought FMLA paperwork to be filled on the recommendation of vascular. Advised her to take the paperwork to them as they are the one who suggested he changes. Sh eis instructed t reach back to the clinic if they decline her request.  New dx Chronic venous insufficiency (DVI): Need to keep feet elevated while working or transition to possible telehealth. varicose Patient Active Problem List   Diagnosis Date Noted   Tobacco use disorder, continuous 08/24/2022   Encounter for annual physical examination excluding gynecological examination in a patient older than 17 years 08/24/2022   Encounter to establish care 08/14/2022   Allergic asthma 08/14/2022   Family history of diabetes mellitus 08/14/2022   Family history of cardiac disorder in mother 08/14/2022   Encounter for smoking cessation counseling 08/14/2022   Current smoker 08/14/2022   History of total hysterectomy 08/14/2022   Endometriosis 10/27/2020   Tobacco use 09/02/2018   Gallbladder sludge 10/02/2017   Cervical lymphadenopathy 01/23/2017   Factor V Leiden (HCC) 01/17/2017   Allergy to alpha-gal 01/17/2017   Varicose veins of right lower extremity with complications 09/06/2015   Varicose veins of right lower extremity 12/28/2014   Migraine 12/03/2014   Tree nut  allergy 12/03/2014   Allergic rhinitis 12/03/2014   Varicose veins of leg with complications 10/27/2014   Past Medical History:  Diagnosis Date   Allergy    factor fived deficiency   Asthma    Blood clot in vein    ? question of clot in vein per the patient, no documentation to support DVT in [facility]   Current smoker    Factor V deficiency (HCC)    Factor V Leiden carrier (HCC)    Migraine    Past Surgical History:  Procedure Laterality Date   ABDOMINAL HYSTERECTOMY  2020   total   CHOLECYSTECTOMY N/A 10/05/2017   Procedure: LAPAROSCOPIC CHOLECYSTECTOMY;  Surgeon: Lucretia Roers, MD;  Location: [facility];  Service: General;  Laterality: N/A;   TONSILLECTOMY     VEIN SURGERY  12/2014   WISDOM TOOTH EXTRACTION  2015   Social History   Tobacco Use   Smoking status: Every Day    Packs/day: 0.50    Years: 4.00    Additional pack years: 0.00    Total pack years: 2.00    Types: Cigarettes   Smokeless tobacco: Never  Vaping Use   Vaping Use: Former  Substance Use Topics   Alcohol use: Yes    Alcohol/week: 0.0 standard drinks of alcohol    Comment: occ   Drug use: No   Social History   Socioeconomic History   Marital status: Married    Spouse name: Not on file   Number of children: 0  Years of education: Not on file   Highest education level: Not on file  Occupational History   Not on file  Tobacco Use   Smoking status: Every Day    Packs/day: 0.50    Years: 4.00    Additional pack years: 0.00    Total pack years: 2.00    Types: Cigarettes   Smokeless tobacco: Never  Vaping Use   Vaping Use: Former  Substance and Sexual Activity   Alcohol use: Yes    Alcohol/week: 0.0 standard drinks of alcohol    Comment: occ   Drug use: No   Sexual activity: Yes    Birth control/protection: Condom, Surgical    Comment: total hysterectomy  Other Topics Concern   Not on file  Social History Narrative   Not on file   Social Determinants of Health   Financial  Resource Strain: Not on file  Food Insecurity: No Food Insecurity (08/14/2022)   Hunger Vital Sign    Worried About Running Out of Food in the Last Year: Never true    Ran Out of Food in the Last Year: Never true  Transportation Needs: Not on file  Physical Activity: Not on file  Stress: Not on file  Social Connections: Not on file  Intimate Partner Violence: Not At Risk (08/14/2022)   Humiliation, Afraid, Rape, and Kick questionnaire    Fear of Current or Ex-Partner: No    Emotionally Abused: No    Physically Abused: No    Sexually Abused: No   Family Status  Relation Name Status   Father  Alive   Mother  Alive   MGM  Alive   PGF  Deceased   PGM  Deceased   MGF  Alive   Neg Hx  (Not Specified)   Family History  Problem Relation Age of Onset   Hypertension Father    Hyperlipidemia Father    High Cholesterol Father    Graves' disease Mother    Factor V Leiden deficiency Maternal Grandmother    Clotting disorder Maternal Grandmother    Seizures Neg Hx    Allergies  Allergen Reactions   Alpha-Gal Anaphylaxis   Other Hives and Rash    Tree nuts      Review of Systems  Constitutional: Negative.  Negative for fever and malaise/fatigue.  Eyes: Negative.  Negative for blurred vision and pain.  Respiratory: Negative.  Negative for cough and shortness of breath.   Cardiovascular:  Positive for claudication. Negative for leg swelling.  Neurological:  Negative for dizziness and weakness.   Negative unless indicated in HPI   Objective:     BP 106/70   Pulse 71   Temp 97.8 F (36.6 C) (Temporal)   Ht 5\' 7"  (1.702 m)   Wt 147 lb 12.8 oz (67 kg)   SpO2 97%   BMI 23.15 kg/m  BP Readings from Last 3 Encounters:  09/19/22 106/70  09/12/22 96/67  08/24/22 103/73   Wt Readings from Last 3 Encounters:  09/19/22 147 lb 12.8 oz (67 kg)  09/12/22 147 lb (66.7 kg)  08/24/22 147 lb 3.2 oz (66.8 kg)      Physical Exam Vitals and nursing note reviewed.  Constitutional:       Appearance: Normal appearance.  HENT:     Head: Normocephalic and atraumatic.  Cardiovascular:     Rate and Rhythm: Normal rate.     Pulses: Normal pulses.          Dorsalis pedis pulses are 2+  on the right side and 2+ on the left side.     Heart sounds: Normal heart sounds.  Pulmonary:     Effort: Pulmonary effort is normal. No respiratory distress.     Breath sounds: Normal breath sounds.  Musculoskeletal:     Cervical back: Normal range of motion and neck supple.     Right lower leg: No edema.     Left lower leg: No edema.  Feet:     Right foot:     Skin integrity: Skin integrity normal.     Left foot:     Skin integrity: Skin integrity normal.  Skin:    General: Skin is warm and dry.     Capillary Refill: Capillary refill takes less than 2 seconds.     Findings: No rash.  Neurological:     General: No focal deficit present.     Mental Status: She is alert and oriented to person, place, and time. Mental status is at baseline.      No results found for any visits on 09/19/22.  Last CBC Lab Results  Component Value Date   WBC 4.9 08/14/2022   HGB 14.3 08/14/2022   HCT 43.6 08/14/2022   MCV 93 08/14/2022   MCH 30.4 08/14/2022   RDW 12.5 08/14/2022   PLT 256 08/14/2022   Last metabolic panel Lab Results  Component Value Date   GLUCOSE 82 08/14/2022   NA 141 08/14/2022   K 4.7 08/14/2022   CL 103 08/14/2022   CO2 24 08/14/2022   BUN 14 08/14/2022   CREATININE 0.88 08/14/2022   EGFR 93 08/14/2022   CALCIUM 10.2 08/14/2022   PROT 7.4 08/14/2022   ALBUMIN 5.0 08/14/2022   LABGLOB 2.4 08/14/2022   AGRATIO 2.1 08/14/2022   BILITOT 0.4 08/14/2022   ALKPHOS 68 08/14/2022   AST 33 08/14/2022   ALT 54 (H) 08/14/2022   ANIONGAP 6 09/11/2017   Last lipids Lab Results  Component Value Date   CHOL 199 08/14/2022   HDL 67 08/14/2022   LDLCALC 122 (H) 08/14/2022   TRIG 57 08/14/2022   CHOLHDL 3.0 08/14/2022   Last hemoglobin A1c Lab Results  Component  Value Date   HGBA1C 4.9 08/14/2022        Assessment & Plan:  Chronic venous insufficiency of lower extremity -     Ambulatory referral to Hematology / Oncology  Varicose veins of leg with complications -     Ambulatory referral to Hematology / Oncology  Factor V Leiden [facility]) -     Ambulatory referral to Hematology / Oncology  Courtney Bush is well nourished 27 yrs old, no acute distress Factor V, DVI: Urgent referral to hematology Work accomodation paperwork filled, copy scan (see Emr) She is instructed to reach out to Vascular clinic to [facility] paperwork filled out. Plan of car discussed with client, all questions answered  Return for follow-up as already scheduled.    Arrie Aran Santa Lighter, DNP [facility] Medicine [address] [place], [place] [address] [phone]

## 2022-09-20 NOTE — Telephone Encounter (Signed)
Pt called to let PCP know that she has tried calling [facility] several times to see if they would fill out her ADA form as advised to do by PCP but says the office is not answering her calls or calling her back and she needs her forms sent in to HR ASAP.

## 2022-09-21 ENCOUNTER — Telehealth: Payer: Self-pay | Admitting: Nurse Practitioner

## 2022-09-21 DIAGNOSIS — Z0279 Encounter for issue of other medical certificate: Secondary | ICD-10-CM

## 2022-09-21 NOTE — Telephone Encounter (Signed)
Patient brought Leave of Absence form in to complete   Payment collected - Yes  Form given to HH/RX coordinator for completion.

## 2022-09-22 NOTE — Telephone Encounter (Signed)
Information completed and forwarded to PCP 

## 2022-09-24 ENCOUNTER — Telehealth: Payer: BC Managed Care – PPO | Admitting: Family Medicine

## 2022-09-24 DIAGNOSIS — H60502 Unspecified acute noninfective otitis externa, left ear: Secondary | ICD-10-CM

## 2022-09-24 MED ORDER — CIPROFLOXACIN-DEXAMETHASONE 0.3-0.1 % OT SUSP: 4.0000 [drp] | Freq: Two times a day (BID) | OTIC | 0 refills | Status: DC

## 2022-09-24 MED ORDER — OFLOXACIN 0.3 % OT SOLN: 5.0000 [drp] | Freq: Two times a day (BID) | OTIC | 0 refills | Status: DC

## 2022-09-24 NOTE — Addendum Note (Signed)
Addended by: Christeen Douglas Z on: 09/24/2022 05:41 PM   Modules accepted: Orders

## 2022-09-24 NOTE — Progress Notes (Signed)
E Visit for Ear Pain - Swimmer's Ear  We are sorry that you are not feeling well. Here is how we plan to help!  Based on what you have shared with me it looks like you have Swimmer's Ear.  Swimmer's ear is a redness or swelling, irritation, or infection of your outer ear canal. These symptoms usually occur within a few days of swimming. Your ear canal is a tube that goes from the opening of the ear to the eardrum.  When water stays in your ear canal, germs can grow.  This is a painful condition that often happens to children and swimmers of all ages.  It is not contagious and oral antibiotics are not required to treat uncomplicated swimmer's ear.  The usual symptoms include:    Itchiness inside the ear  Redness or a sense of swelling in the ear  Pain when the ear is tugged on when pressure is placed on the ear  Pus draining from the infected ear     I have prescribed: Ciprofloxin 0.2% and hydrocortisone 1% otic suspension 3 drops in affected ears twice daily for 7 days  In certain cases, swimmer's ear may progress to a more serious bacterial infection of the middle or inner ear.  If you have a fever 102 and up and significantly worsening symptoms, this could indicate a more serious infection moving to the middle/inner and needs face to face evaluation in an office by a provider.  Your symptoms should improve over the next 3 days and should resolve in about 7 days.  Be sure to complete ALL of your prescription.  HOME CARE: Wash your hands frequently. If you are prescribed an ear drop, do not place the tip of the bottle on your ear or touch it with your fingers. You can take Acetaminophen 650 mg every 4-6 hours as needed for pain.  If pain is severe or moderate, you can apply a heating pad (set on low) or hot water bottle (wrapped in a towel) to outer ear for 20 minutes.  This will also increase drainage. Avoid ear plugs Do not go swimming until the symptoms are gone Do not use Q-tips After  showers, help the water run out by tilting your head to one side.   GET HELP RIGHT AWAY IF: Fever is over 102.2 degrees. You develop progressive ear pain or hearing loss. Ear symptoms persist longer than 3 days after treatment.  MAKE SURE YOU: Understand these instructions. Will watch your condition. Will get help right away if you are not doing well or get worse.  TO PREVENT SWIMMER'S EAR: Use a bathing cap or custom fitted swim molds to keep your ears dry. Towel off after swimming to dry your ears. Tilt your head or pull your earlobes to allow the water to escape your ear canal. If there is still water in your ears, consider using a hairdryer on the lowest setting.  Thank you for choosing an e-visit.  Your e-visit answers were reviewed by a board certified advanced clinical practitioner to complete your personal care plan. Depending upon the condition, your plan could have included both over the counter or prescription medications.  Please review your pharmacy choice. Make sure the pharmacy is open so you can pick up the prescription now. If there is a problem, you may contact your provider through [employer] and have the prescription routed to another pharmacy.  Your safety is important to [place]. If you have drug allergies check your prescription carefully.  For the next 24 hours you can use MyChart to ask questions about today's visit, request a non-urgent call back, or ask for a work or school excuse. You will get an email with a survey after your eVisit asking about your experience. We would appreciate your feedback. I hope that your e-visit has been valuable and will aid in your recovery.   I have spent 5 minutes in review of e-visit questionnaire, review and updating patient chart, medical decision making and response to patient.   Tylene Fantasia Ward, PA-C

## 2022-09-25 ENCOUNTER — Telehealth: Payer: Self-pay

## 2022-09-25 ENCOUNTER — Other Ambulatory Visit: Payer: Self-pay

## 2022-09-25 DIAGNOSIS — D6851 Activated protein C resistance: Secondary | ICD-10-CM

## 2022-09-25 MED ORDER — OFLOXACIN 0.3 % OT SOLN: 5.0000 [drp] | Freq: Two times a day (BID) | OTIC | 0 refills | Status: AC

## 2022-09-25 NOTE — Progress Notes (Signed)
[facility] [address] [place] [address]   Clinic Day:  09/26/2022  Referring physician: Carlis Stable*  Patient Care Team: [facility], Dois Davenport, NP as PCP - General (Nurse Practitioner)   ASSESSMENT & PLAN:   Assessment: ***  Plan: ***  No orders of the defined types were placed in this encounter.     Alben Deeds Teague,acting as a Neurosurgeon for Doreatha Massed, MD.,have documented all relevant documentation on the behalf of Doreatha Massed, MD,as directed by  Doreatha Massed, MD while in the presence of Doreatha Massed, MD.   ***  [place] R Teague   7/15/20248:15 PM  CHIEF COMPLAINT/PURPOSE OF CONSULT:   Diagnosis: Factor V Leiden mutation  Current Therapy:  ***  HISTORY OF PRESENT ILLNESS:   Courtney Bush is a 27 y.o. female presenting to clinic today for evaluation of Factor V Leiden mutation at the request of Martina Sinner, NP as PCP.  Patient has been aware of her Factor V deficiency since she was a child and was seen by other hematologists, with her most recent retiring. Her most recent TSH lab from 08/14/22 are WNL. Her most recent lipid pane from 08/14/22 showed abnormal LDL Chol Calc at 122. Her most recent CBC from 08/14/22 showed abnormal absolute EOS at 0.5, and her most recent CMP showed abnormal ALT at 54. Her Bayer DCA Hb A1c Waived lab on 08/14/22 was WNL.    Today, she states that she is doing well overall. Her appetite level is at ***%. Her energy level is at ***%.  PAST MEDICAL HISTORY:   Past Medical History: Past Medical History:  Diagnosis Date   Allergy    factor fived deficiency   Asthma    Blood clot in vein    ? question of clot in vein per the patient, no documentation to support DVT in [facility]   Current smoker    Factor V deficiency (HCC)    Factor V Leiden carrier (HCC)    Migraine     Surgical History: Past Surgical History:  Procedure Laterality Date   ABDOMINAL  HYSTERECTOMY  2020   total   CHOLECYSTECTOMY N/A 10/05/2017   Procedure: LAPAROSCOPIC CHOLECYSTECTOMY;  Surgeon: Lucretia Roers, MD;  Location: [facility];  Service: General;  Laterality: N/A;   TONSILLECTOMY     VEIN SURGERY  12/2014   WISDOM TOOTH EXTRACTION  2015    Social History: Social History   Socioeconomic History   Marital status: Married    Spouse name: Not on file   Number of children: 0   Years of education: Not on file   Highest education level: Not on file  Occupational History   Not on file  Tobacco Use   Smoking status: Every Day    Current packs/day: 0.50    Average packs/day: 0.5 packs/day for 4.0 years (2.0 ttl pk-yrs)    Types: Cigarettes   Smokeless tobacco: Never  Vaping Use   Vaping status: Former  Substance and Sexual Activity   Alcohol use: Yes    Alcohol/week: 0.0 standard drinks of alcohol    Comment: occ   Drug use: No   Sexual activity: Yes    Birth control/protection: Condom, Surgical    Comment: total hysterectomy  Other Topics Concern   Not on file  Social History Narrative   Not on file   Social Determinants of Health   Financial Resource Strain: Low Risk  (08/12/2018)   Received from  [facility], [facility]   Overall Financial Resource Strain (CARDIA)    Difficulty of Paying Living Expenses: Not hard at all  Food Insecurity: No Food Insecurity (08/14/2022)   Hunger Vital Sign    Worried About Running Out of Food in the Last Year: Never true    Ran Out of Food in the Last Year: Never true  Transportation Needs: No Transportation Needs (08/12/2018)   Received from [facility], [facility]   [facility] - Transportation    Lack of Transportation (Medical): No    Lack of Transportation (Non-Medical): No  Physical Activity: Sufficiently Active (02/12/2020)   Received from [facility], [facility]   Exercise Vital Sign    Days of Exercise per Week: 3 days    Minutes of Exercise per Session: 60 min  Stress: No  Stress Concern Present (08/12/2018)   Received from [facility], [facility] - Occupational Stress Questionnaire    Feeling of Stress : Only a little  Social Connections: Moderately Integrated (08/12/2018)   Received from [facility], [facility]   Social Connection and Isolation Panel [NHANES]    Frequency of Communication with Friends and Family: More than three times a week    Frequency of Social Gatherings with Friends and Family: More than three times a week    Attends Religious Services: More than 4 times per year    Active Member of [employer] or Organizations: No    Attends Banker Meetings: Never    Marital Status: Married  Catering manager Violence: Not At Risk (08/14/2022)   Humiliation, Afraid, Rape, and Kick questionnaire    Fear of Current or Ex-Partner: No    Emotionally Abused: No    Physically Abused: No    Sexually Abused: No    Family History: Family History  Problem Relation Age of Onset   Hypertension Father    Hyperlipidemia Father    High Cholesterol Father    Graves' disease Mother    Factor V Leiden deficiency Maternal Grandmother    Clotting disorder Maternal Grandmother    Seizures Neg Hx     Current Medications:  Current Outpatient Medications:    albuterol (VENTOLIN HFA) 108 (90 Base) MCG/ACT inhaler, Inhale 1 puff into the lungs every 6 (six) hours as needed for wheezing or shortness of breath., Disp: 18 g, Rfl: 1   cetirizine (ZYRTEC) 10 MG tablet, Take 1 tablet (10 mg total) by mouth daily., Disp: 90 tablet, Rfl: 1   EPINEPHrine (EPIPEN) 0.3 mg/0.3 mL SOAJ, Inject 0.3 mLs (0.3 mg total) into the muscle once., Disp: 1 Device, Rfl: 1   estradiol (ESTRACE) 0.1 MG/GM vaginal cream, Small finger tip amount at bedtime, Disp: , Rfl:    fluticasone (FLONASE) 50 MCG/ACT nasal spray, Place into both nostrils daily., Disp: , Rfl:    ibuprofen (ADVIL,MOTRIN) 200 MG tablet, Take 400 mg by mouth  every 6 (six) hours as needed for moderate pain. , Disp: , Rfl:    ofloxacin (FLOXIN) 0.3 % OTIC solution, Place 5 drops into the left ear 2 (two) times daily for 7 days., Disp: 5 mL, Rfl: 0   Allergies: Allergies  Allergen Reactions   Alpha-Gal Anaphylaxis   Other Hives and Rash    Tree nuts    REVIEW OF SYSTEMS:   Review of Systems  Constitutional:  Negative for chills, fatigue and fever.  HENT:   Negative  for lump/mass, mouth sores, nosebleeds, sore throat and trouble swallowing.   Eyes:  Negative for eye problems.  Respiratory:  Negative for cough and shortness of breath.   Cardiovascular:  Negative for chest pain, leg swelling and palpitations.  Gastrointestinal:  Negative for abdominal pain, constipation, diarrhea, nausea and vomiting.  Genitourinary:  Negative for bladder incontinence, difficulty urinating, dysuria, frequency, hematuria and nocturia.   Musculoskeletal:  Negative for arthralgias, back pain, flank pain, myalgias and neck pain.  Skin:  Negative for itching and rash.  Neurological:  Negative for dizziness, headaches and numbness.  Hematological:  Does not bruise/bleed easily.  Psychiatric/Behavioral:  Negative for depression, sleep disturbance and suicidal ideas. The patient is not nervous/anxious.   All other systems reviewed and are negative.    VITALS:   There were no vitals taken for this visit.  Wt Readings from Last 3 Encounters:  09/19/22 147 lb 12.8 oz (67 kg)  09/12/22 147 lb (66.7 kg)  08/24/22 147 lb 3.2 oz (66.8 kg)    There is no height or weight on file to calculate BMI.   PHYSICAL EXAM:   Physical Exam Vitals and nursing note reviewed. Exam conducted with a chaperone present.  Constitutional:      Appearance: Normal appearance.  Cardiovascular:     Rate and Rhythm: Normal rate and regular rhythm.     Pulses: Normal pulses.     Heart sounds: Normal heart sounds.  Pulmonary:     Effort: Pulmonary effort is normal.     Breath sounds:  Normal breath sounds.  Abdominal:     Palpations: Abdomen is soft. There is no hepatomegaly, splenomegaly or mass.     Tenderness: There is no abdominal tenderness.  Musculoskeletal:     Right lower leg: No edema.     Left lower leg: No edema.  Lymphadenopathy:     Cervical: No cervical adenopathy.     Right cervical: No superficial, deep or posterior cervical adenopathy.    Left cervical: No superficial, deep or posterior cervical adenopathy.     Upper Body:     Right upper body: No supraclavicular or axillary adenopathy.     Left upper body: No supraclavicular or axillary adenopathy.  Neurological:     General: No focal deficit present.     Mental Status: She is alert and oriented to person, place, and time.  Psychiatric:        Mood and Affect: Mood normal.        Behavior: Behavior normal.     LABS:      Latest Ref Rng & Units 08/14/2022    8:51 AM 09/11/2017    9:15 AM 02/13/2017    2:15 PM  CBC  WBC 3.4 - 10.8 x10E3/uL 4.9  4.9  5.6   Hemoglobin 11.1 - 15.9 g/dL 19.1  47.8  29.5   Hematocrit 34.0 - 46.6 % 43.6  43.8  40.3   Platelets 150 - 450 x10E3/uL 256  260  290       Latest Ref Rng & Units 08/14/2022    8:51 AM 09/11/2017    9:15 AM 02/13/2017    2:15 PM  CMP  Glucose 70 - 99 mg/dL 82  88  95   BUN 6 - 20 mg/dL 14  9  13    Creatinine 0.57 - 1.00 mg/dL 6.21  3.08  6.57   Sodium 134 - 144 mmol/L 141  142  138   Potassium 3.5 - 5.2 mmol/L 4.7  4.6  4.0   Chloride 96 - 106 mmol/L 103  108  105   CO2 20 - 29 mmol/L 24  28  27    Calcium 8.7 - 10.2 mg/dL 16.1  9.5  9.3   Total Protein 6.0 - 8.5 g/dL 7.4  7.6    Total Bilirubin 0.0 - 1.2 mg/dL 0.4  0.8    Alkaline Phos 44 - 121 IU/L 68  40    AST 0 - 40 IU/L 33  20    ALT 0 - 32 IU/L 54  16       No results found for: "CEA1", "CEA" / No results found for: "CEA1", "CEA" No results found for: "PSA1" No results found for: "WRU045" No results found for: "CAN125"  No results found for: "TOTALPROTELP", "ALBUMINELP",  "A1GS", "A2GS", "BETS", "BETA2SER", "GAMS", "MSPIKE", "SPEI" No results found for: "TIBC", "FERRITIN", "IRONPCTSAT" No results found for: "LDH"   STUDIES:   VAS [place] LOWER EXTREMITY VENOUS REFLUX  Result Date: 09/12/2022  Lower Venous Reflux Study Patient Name:  MYCHELLE KENDRA [facility]  Date of Exam:   09/12/2022 Medical Rec #: [id number]             Accession #:    [id number] Date of Birth: Jun 19, 1995             Patient Gender: F Patient Age:   89 years Exam Location:  [facility] Procedure:      VAS [place] LOWER EXTREMITY VENOUS REFLUX Referring Phys: Heath Lark --------------------------------------------------------------------------------  Indications: Recurrent varicosities of the right popliteal fossa. History of stab phlebectomy 2016  Performing Technologist: Dorthula Matas RVS, RCS  Examination Guidelines: A complete evaluation includes B-mode imaging, spectral Doppler, color Doppler, and power Doppler as needed of all accessible portions of each vessel. Bilateral testing is considered an integral part of a complete examination. Limited examinations for reoccurring indications may be performed as noted. The reflux portion of the exam is performed with the patient in reverse Trendelenburg. Significant venous reflux is defined as >500 ms in the superficial venous system, and >1 second in the deep venous system.  Venous Reflux Times +--------------+---------+------+-----------+------------+-------------+ RIGHT         Reflux NoRefluxReflux TimeDiameter cmsComments                              Yes                                       +--------------+---------+------+-----------+------------+-------------+ CFV                     yes   >1 second                           +--------------+---------+------+-----------+------------+-------------+ FV mid                  yes   >1 second                            +--------------+---------+------+-----------+------------+-------------+ Popliteal               yes   >1 second                           +--------------+---------+------+-----------+------------+-------------+  GSV at [facility]              yes    >500 ms      0.63                  +--------------+---------+------+-----------+------------+-------------+ GSV prox thighno                            0.19                  +--------------+---------+------+-----------+------------+-------------+ GSV mid thigh no                            0.19                  +--------------+---------+------+-----------+------------+-------------+ GSV dist thigh                                      out of fascia +--------------+---------+------+-----------+------------+-------------+ GSV at knee                                         out of fascia +--------------+---------+------+-----------+------------+-------------+ SSV Pop Fossa no                            0.31                  +--------------+---------+------+-----------+------------+-------------+   Summary: Right: - No evidence of deep vein thrombosis from the common femoral through the popliteal veins. - The deep venous system is not competent. - The great saphenous vein is competent. - The small saphenous vein is competent. - Focal area of chronic thrombus which appears to be in a varicosity off of a perforator in the popliteal fossa.  *See table(s) above for measurements and observations. Electronically signed by Heath Lark on 09/12/2022 at 5:03:49 PM.    Final

## 2022-09-25 NOTE — Addendum Note (Signed)
Addended by: Freddy Finner on: 09/25/2022 09:35 AM   Modules accepted: Orders

## 2022-09-25 NOTE — Telephone Encounter (Signed)
Returned pt's call regarding FMLA. Left her a voicemail to call us back at her convenience.

## 2022-09-26 ENCOUNTER — Inpatient Hospital Stay: Payer: BC Managed Care – PPO | Attending: Hematology | Admitting: Hematology

## 2022-09-26 ENCOUNTER — Encounter: Payer: Self-pay | Admitting: Hematology

## 2022-09-26 VITALS — BP 111/73 | HR 72 | Temp 98.2°F | Resp 17 | Ht 67.0 in | Wt 147.9 lb

## 2022-09-26 DIAGNOSIS — I82531 Chronic embolism and thrombosis of right popliteal vein: Secondary | ICD-10-CM | POA: Diagnosis not present

## 2022-09-26 DIAGNOSIS — I83811 Varicose veins of right lower extremities with pain: Secondary | ICD-10-CM

## 2022-09-26 DIAGNOSIS — H6692 Otitis media, unspecified, left ear: Secondary | ICD-10-CM | POA: Diagnosis not present

## 2022-09-26 DIAGNOSIS — F17210 Nicotine dependence, cigarettes, uncomplicated: Secondary | ICD-10-CM | POA: Diagnosis not present

## 2022-09-26 DIAGNOSIS — Z832 Family history of diseases of the blood and blood-forming organs and certain disorders involving the immune mechanism: Secondary | ICD-10-CM | POA: Diagnosis not present

## 2022-09-26 DIAGNOSIS — D6851 Activated protein C resistance: Secondary | ICD-10-CM

## 2022-09-26 DIAGNOSIS — Z6823 Body mass index (BMI) 23.0-23.9, adult: Secondary | ICD-10-CM | POA: Diagnosis not present

## 2022-09-26 NOTE — Patient Instructions (Addendum)
[facility]  Discharge Instructions  You were seen and examined today by Dr. Ellin Saba. Dr. Ellin Saba is a hematologist, meaning that he specializes in blood abnormalities. Dr. Ellin Saba discussed your past medical history, family history of cancers/blood conditions and the events that led to you being here today.  You were referred to Dr. Ellin Saba due to Factor V.  Dr. Ellin Saba recommends that you do compresses and take aspirin 325 mg once daily for a week for the thrombus in the the surface vein of your leg along with wearing the compression stockings. You can start take aspirin 81 mg daily.  Follow-up as needed.    Thank you for choosing [facility] to provide your oncology and hematology care.   To afford each patient quality time with our provider, please arrive at least 15 minutes before your scheduled appointment time. You may need to reschedule your appointment if you arrive late (10 or more minutes). Arriving late affects you and other patients whose appointments are after yours.  Also, if you miss three or more appointments without notifying the office, you may be dismissed from the clinic at the provider's discretion.    Again, thank you for choosing [facility].  Our hope is that these requests will decrease the amount of time that you wait before being seen by our physicians.   If you have a lab appointment with the Cancer Center - please note that after April 8th, all labs will be drawn in the cancer center.  You do not have to check in or register with the main entrance as you have in the past but will complete your check-in at the cancer center.            _____________________________________________________________  Should you have questions after your visit to [facility], please contact our office at [phone] and follow the prompts.  Our office hours are 8:00 a.m. to 4:30 p.m.  Monday - Thursday and 8:00 a.m. to 2:30 p.m. Friday.  Please note that voicemails left after 4:00 p.m. may not be returned until the following business day.  We are closed weekends and all major holidays.  You do have access to a nurse 24-7, just call the main number to the clinic [phone] and do not press any options, hold on the line and a nurse will answer the phone.    For prescription refill requests, have your pharmacy contact our office and allow 72 hours.    Masks are no longer required in the cancer centers. If you would like for your care team to wear a mask while they are taking care of you, please let them know. You may have one support person who is at least 27 years old accompany you for your appointments.

## 2022-09-28 ENCOUNTER — Inpatient Hospital Stay: Payer: BC Managed Care – PPO | Admitting: Hematology

## 2022-10-04 NOTE — Telephone Encounter (Signed)
PCP completed and signed FMLA forms. They have been faxed to pt at her email address [email or web address], there was no company fax# to send this to. Patient has been contacted and informed they are complete.

## 2023-02-01 ENCOUNTER — Telehealth: Payer: Self-pay

## 2023-02-01 NOTE — Telephone Encounter (Signed)
Pt called with c/o LLE aching with "pins and needles" feeling x 2 days. She primarily notices this when she is sitting at work, as she has a Office manager. She will stand up and move around and changing positions will help. I have advised her to continue wearing compression, alternate btwn elevating and walking if she finds that provides some relief and to call her PCP. She feels it is going up her thigh at times, but her back does not hurt. Pt has also been advised to call us back if this persists or worsens.

## 2023-02-13 ENCOUNTER — Encounter: Payer: BC Managed Care – PPO | Admitting: Nurse Practitioner

## 2023-02-20 ENCOUNTER — Ambulatory Visit: Payer: BC Managed Care – PPO | Admitting: Nurse Practitioner

## 2023-02-20 NOTE — Progress Notes (Unsigned)
Established Patient Office Visit  Subjective   Patient ID: Courtney Bush, female    DOB: 1995/08/27  Age: 27 y.o. MRN: [id number]  No chief complaint on file.   HPI  Patient Active Problem List   Diagnosis Date Noted   Tobacco use disorder, continuous 08/24/2022   Encounter for annual physical examination excluding gynecological examination in a patient older than 17 years 08/24/2022   Encounter to establish care 08/14/2022   Allergic asthma 08/14/2022   Family history of diabetes mellitus 08/14/2022   Family history of cardiac disorder in mother 08/14/2022   Encounter for smoking cessation counseling 08/14/2022   Current smoker 08/14/2022   History of total hysterectomy 08/14/2022   Endometriosis 10/27/2020   Tobacco use 09/02/2018   Gallbladder sludge 10/02/2017   Cervical lymphadenopathy 01/23/2017   Factor V Leiden (HCC) 01/17/2017   Allergy to alpha-gal 01/17/2017   Varicose veins of right lower extremity with complications 09/06/2015   Varicose veins of right lower extremity 12/28/2014   Migraine 12/03/2014   Tree nut allergy 12/03/2014   Allergic rhinitis 12/03/2014   Varicose veins of leg with complications 10/27/2014   Past Medical History:  Diagnosis Date   Allergy    factor fived deficiency   Asthma    Blood clot in vein    ? question of clot in vein per the patient, no documentation to support DVT in [facility]   Current smoker    Factor V deficiency (HCC)    Factor V Leiden carrier (HCC)    Migraine    Past Surgical History:  Procedure Laterality Date   ABDOMINAL HYSTERECTOMY  2020   total   CHOLECYSTECTOMY N/A 10/05/2017   Procedure: LAPAROSCOPIC CHOLECYSTECTOMY;  Surgeon: Lucretia Roers, MD;  Location: [facility];  Service: General;  Laterality: N/A;   TONSILLECTOMY     VEIN SURGERY  12/2014   WISDOM TOOTH EXTRACTION  2015   Social History   Tobacco Use   Smoking status: Every Day    Current packs/day: 0.50    Average packs/day:  0.5 packs/day for 4.0 years (2.0 ttl pk-yrs)    Types: Cigarettes   Smokeless tobacco: Never  Vaping Use   Vaping status: Former  Substance Use Topics   Alcohol use: Yes    Alcohol/week: 0.0 standard drinks of alcohol    Comment: occ   Drug use: No   Social History   Socioeconomic History   Marital status: Married    Spouse name: Not on file   Number of children: 0   Years of education: Not on file   Highest education level: Not on file  Occupational History   Not on file  Tobacco Use   Smoking status: Every Day    Current packs/day: 0.50    Average packs/day: 0.5 packs/day for 4.0 years (2.0 ttl pk-yrs)    Types: Cigarettes   Smokeless tobacco: Never  Vaping Use   Vaping status: Former  Substance and Sexual Activity   Alcohol use: Yes    Alcohol/week: 0.0 standard drinks of alcohol    Comment: occ   Drug use: No   Sexual activity: Yes    Birth control/protection: Condom, Surgical    Comment: total hysterectomy  Other Topics Concern   Not on file  Social History Narrative   Not on file   Social Determinants of Health   Financial Resource Strain: Low Risk  (08/12/2018)   Received from [facility], [facility]   Overall Financial  Resource Strain (CARDIA)    Difficulty of Paying Living Expenses: Not hard at all  Food Insecurity: No Food Insecurity (08/14/2022)   Hunger Vital Sign    Worried About Running Out of Food in the Last Year: Never true    Ran Out of Food in the Last Year: Never true  Transportation Needs: No Transportation Needs (08/12/2018)   Received from [facility], [facility]   [facility] - Transportation    Lack of Transportation (Medical): No    Lack of Transportation (Non-Medical): No  Physical Activity: Sufficiently Active (02/12/2020)   Received from [facility], [facility]   Exercise Vital Sign    Days of Exercise per Week: 3 days    Minutes of Exercise per Session: 60 min  Stress: No Stress Concern Present (08/12/2018)    Received from [facility], [facility] - Occupational Stress Questionnaire    Feeling of Stress : Only a little  Social Connections: Moderately Integrated (08/12/2018)   Received from [facility], [facility]   Social Connection and Isolation Panel [NHANES]    Frequency of Communication with Friends and Family: More than three times a week    Frequency of Social Gatherings with Friends and Family: More than three times a week    Attends Religious Services: More than 4 times per year    Active Member of [employer] or Organizations: No    Attends Banker Meetings: Never    Marital Status: Married  Catering manager Violence: Not At Risk (08/14/2022)   Humiliation, Afraid, Rape, and Kick questionnaire    Fear of Current or Ex-Partner: No    Emotionally Abused: No    Physically Abused: No    Sexually Abused: No   Family Status  Relation Name Status   Father  Alive   Mother  Alive   MGM  Alive   PGF  Deceased   PGM  Deceased   MGF  Alive   Neg Hx  (Not Specified)  No partnership data on file   Family History  Problem Relation Age of Onset   Hypertension Father    Hyperlipidemia Father    High Cholesterol Father    Graves' disease Mother    Factor V Leiden deficiency Maternal Grandmother    Clotting disorder Maternal Grandmother    Seizures Neg Hx    Allergies  Allergen Reactions   Alpha-Gal Anaphylaxis   Other Hives and Rash    Tree nuts      ROS Negative unless indicated in HPI   Objective:     There were no vitals taken for this visit. BP Readings from Last 3 Encounters:  09/26/22 111/73  09/19/22 106/70  09/12/22 96/67   Wt Readings from Last 3 Encounters:  09/26/22 147 lb 14.4 oz (67.1 kg)  09/19/22 147 lb 12.8 oz (67 kg)  09/12/22 147 lb (66.7 kg)      Physical Exam   No results found for any visits on 02/21/23.  Last CBC Lab Results  Component Value Date   WBC 4.9 08/14/2022    HGB 14.3 08/14/2022   HCT 43.6 08/14/2022   MCV 93 08/14/2022   MCH 30.4 08/14/2022   RDW 12.5 08/14/2022   PLT 256 08/14/2022   Last metabolic panel Lab Results  Component Value Date   GLUCOSE 82 08/14/2022   NA 141 08/14/2022   K 4.7 08/14/2022   CL 103 08/14/2022  CO2 24 08/14/2022   BUN 14 08/14/2022   CREATININE 0.88 08/14/2022   EGFR 93 08/14/2022   CALCIUM 10.2 08/14/2022   PROT 7.4 08/14/2022   ALBUMIN 5.0 08/14/2022   LABGLOB 2.4 08/14/2022   AGRATIO 2.1 08/14/2022   BILITOT 0.4 08/14/2022   ALKPHOS 68 08/14/2022   AST 33 08/14/2022   ALT 54 (H) 08/14/2022   ANIONGAP 6 09/11/2017   Last lipids Lab Results  Component Value Date   CHOL 199 08/14/2022   HDL 67 08/14/2022   LDLCALC 122 (H) 08/14/2022   TRIG 57 08/14/2022   CHOLHDL 3.0 08/14/2022   Last hemoglobin A1c Lab Results  Component Value Date   HGBA1C 4.9 08/14/2022   Last thyroid functions Lab Results  Component Value Date   TSH 1.180 08/14/2022   T4TOTAL 8.7 02/14/2017        Assessment & Plan:  There are no diagnoses linked to this encounter. Continue healthy lifestyle choices, including diet (rich in fruits, vegetables, and lean proteins, and low in salt and simple carbohydrates) and exercise (at least 30 minutes of moderate physical activity daily).     The above assessment and management plan was discussed with the patient. The patient verbalized understanding of and has agreed to the management plan. Patient is aware to call the clinic if they develop any new symptoms or if symptoms persist or worsen. Patient is aware when to return to the clinic for a follow-up visit. Patient educated on when it is appropriate to go to the emergency department.  No follow-ups on file.    Arrie Aran Santa Lighter, [facility] [address] [place], [place] [address] [phone]    Note: This document was prepared by [employer] voice dictation technology and any errors  that results from this process are unintentional.

## 2023-02-21 ENCOUNTER — Ambulatory Visit: Payer: BC Managed Care – PPO | Admitting: Nurse Practitioner

## 2023-02-21 ENCOUNTER — Encounter: Payer: Self-pay | Admitting: Nurse Practitioner

## 2023-02-21 VITALS — BP 93/64 | HR 77 | Temp 97.3°F | Ht 67.0 in | Wt 151.2 lb

## 2023-02-21 DIAGNOSIS — J4520 Mild intermittent asthma, uncomplicated: Secondary | ICD-10-CM | POA: Diagnosis not present

## 2023-02-21 DIAGNOSIS — I83811 Varicose veins of right lower extremities with pain: Secondary | ICD-10-CM | POA: Diagnosis not present

## 2023-02-21 DIAGNOSIS — Z716 Tobacco abuse counseling: Secondary | ICD-10-CM

## 2023-02-21 DIAGNOSIS — E7889 Other lipoprotein metabolism disorders: Secondary | ICD-10-CM

## 2023-02-21 DIAGNOSIS — G43009 Migraine without aura, not intractable, without status migrainosus: Secondary | ICD-10-CM | POA: Diagnosis not present

## 2023-02-21 DIAGNOSIS — Z23 Encounter for immunization: Secondary | ICD-10-CM | POA: Diagnosis not present

## 2023-02-22 DIAGNOSIS — Z23 Encounter for immunization: Secondary | ICD-10-CM | POA: Insufficient documentation

## 2023-02-22 DIAGNOSIS — E7889 Other lipoprotein metabolism disorders: Secondary | ICD-10-CM | POA: Insufficient documentation

## 2023-02-22 LAB — LIPID PANEL
Chol/HDL Ratio: 3 {ratio} (ref 0.0–4.4)
Cholesterol, Total: 185 mg/dL (ref 100–199)
HDL: 61 mg/dL (ref >39)
LDL Chol Calc (NIH): 113 mg/dL — ABNORMAL HIGH (ref 0–99)
Triglycerides: 55 mg/dL (ref 0–149)
VLDL Cholesterol Cal: 11 mg/dL (ref 5–40)

## 2023-02-23 DIAGNOSIS — Z1272 Encounter for screening for malignant neoplasm of vagina: Secondary | ICD-10-CM | POA: Diagnosis not present

## 2023-02-23 DIAGNOSIS — Z01419 Encounter for gynecological examination (general) (routine) without abnormal findings: Secondary | ICD-10-CM | POA: Diagnosis not present

## 2023-04-06 ENCOUNTER — Ambulatory Visit: Payer: BC Managed Care – PPO | Admitting: Family Medicine

## 2023-04-06 VITALS — BP 117/85 | HR 76 | Temp 96.5°F | Ht 67.0 in | Wt 153.8 lb

## 2023-04-06 DIAGNOSIS — R1084 Generalized abdominal pain: Secondary | ICD-10-CM | POA: Diagnosis not present

## 2023-04-06 DIAGNOSIS — R202 Paresthesia of skin: Secondary | ICD-10-CM

## 2023-04-06 DIAGNOSIS — R195 Other fecal abnormalities: Secondary | ICD-10-CM | POA: Diagnosis not present

## 2023-04-06 DIAGNOSIS — R42 Dizziness and giddiness: Secondary | ICD-10-CM | POA: Diagnosis not present

## 2023-04-06 DIAGNOSIS — R1013 Epigastric pain: Secondary | ICD-10-CM

## 2023-04-06 DIAGNOSIS — R6889 Other general symptoms and signs: Secondary | ICD-10-CM | POA: Diagnosis not present

## 2023-04-06 MED ORDER — PANTOPRAZOLE SODIUM 40 MG PO TBEC
40.0000 mg | DELAYED_RELEASE_TABLET | Freq: Every day | ORAL | 3 refills | Status: AC
Start: 2023-04-06 — End: ?

## 2023-04-06 NOTE — Progress Notes (Signed)
Subjective:  Patient ID: Courtney Bush, female    DOB: 1995/10/11, 28 y.o.   MRN: [id number]  Patient Care Team: Martina Sinner, NP as PCP - General (Nurse Practitioner)   Chief Complaint:  Abdominal Pain (X 2-3 weeks on and off.  Patient states when she has the abd pain she has dark stool. ) and Dizziness (Patient states that when she has dizzy spells your blood pressure drops and her bilateral hands and feet get numb. )   HPI: Courtney Bush is a 28 y.o. female presenting on 04/06/2023 for Abdominal Pain (X 2-3 weeks on and off.  Patient states when she has the abd pain she has dark stool. ) and Dizziness (Patient states that when she has dizzy spells your blood pressure drops and her bilateral hands and feet get numb. )   Discussed the use of AI scribe software for clinical note transcription with the patient, who gave verbal consent to proceed.  History of Present Illness The patient, with a known history of a blood disorder and a past hysterectomy, presented with a few weeks' history of intermittent upper abdominal pain, described as severe heartburn. The pain was associated with black, grainy stools, which the patient noticed a few times. Accompanying these symptoms, the patient reported episodes of lightheadedness, dizziness, and hypotension, with blood pressure readings often around 90/60 during these episodes. The patient also experienced numbness in the hands and feet, and at times, felt so unwell that she was unable to speak.  The patient also reported occasional chest pains, located in the middle of the chest. She denied taking iron supplements regularly and reported only occasional use of ibuprofen. The patient also mentioned a previous finding of elevated ALT levels in her blood. She has a history of two blood clots but is not currently on any blood thinners due to her age.        Relevant past medical, surgical, family, and social history reviewed and  updated as indicated.  Allergies and medications reviewed and updated. Data reviewed: Chart in Epic.   Past Medical History:  Diagnosis Date   Allergy    factor fived deficiency   Asthma    Blood clot in vein    ? question of clot in vein per the patient, no documentation to support DVT in [facility]   Current smoker    Factor V deficiency (HCC)    Factor V Leiden carrier (HCC)    Migraine     Past Surgical History:  Procedure Laterality Date   ABDOMINAL HYSTERECTOMY  2020   total   CHOLECYSTECTOMY N/A 10/05/2017   Procedure: LAPAROSCOPIC CHOLECYSTECTOMY;  Surgeon: Lucretia Roers, MD;  Location: [facility];  Service: General;  Laterality: N/A;   TONSILLECTOMY     VEIN SURGERY  12/2014   WISDOM TOOTH EXTRACTION  2015    Social History   Socioeconomic History   Marital status: Married    Spouse name: Not on file   Number of children: 0   Years of education: Not on file   Highest education level: Bachelor's degree (e.g., BA, AB, BS)  Occupational History   Not on file  Tobacco Use   Smoking status: Every Day    Current packs/day: 0.50    Average packs/day: 0.5 packs/day for 4.0 years (2.0 ttl pk-yrs)    Types: Cigarettes   Smokeless tobacco: Never  Vaping Use   Vaping status: Former  Substance and Sexual Activity   Alcohol  use: Yes    Alcohol/week: 0.0 standard drinks of alcohol    Comment: occ   Drug use: No   Sexual activity: Yes    Birth control/protection: Condom, Surgical    Comment: total hysterectomy  Other Topics Concern   Not on file  Social History Narrative   Not on file   Social Drivers of Health   Financial Resource Strain: Low Risk  (04/06/2023)   Overall Financial Resource Strain (CARDIA)    Difficulty of Paying Living Expenses: Not hard at all  Food Insecurity: No Food Insecurity (04/06/2023)   Hunger Vital Sign    Worried About Running Out of Food in the Last Year: Never true    Ran Out of Food in the Last Year: Never true   Transportation Needs: No Transportation Needs (04/06/2023)   PRAPARE - Administrator, Civil Service (Medical): No    Lack of Transportation (Non-Medical): No  Physical Activity: Insufficiently Active (04/06/2023)   Exercise Vital Sign    Days of Exercise per Week: 4 days    Minutes of Exercise per Session: 30 min  Stress: Patient Declined (04/06/2023)   [employer] of Occupational Health - Occupational Stress Questionnaire    Feeling of Stress : Patient declined  Social Connections: Moderately Isolated (04/06/2023)   Social Connection and Isolation Panel [NHANES]    Frequency of Communication with Friends and Family: More than three times a week    Frequency of Social Gatherings with Friends and Family: More than three times a week    Attends Religious Services: Never    Database administrator or Organizations: No    Attends Engineer, structural: Not on file    Marital Status: Married  Catering manager Violence: Not At Risk (08/14/2022)   Humiliation, Afraid, Rape, and Kick questionnaire    Fear of Current or Ex-Partner: No    Emotionally Abused: No    Physically Abused: No    Sexually Abused: No    Outpatient Encounter Medications as of 04/06/2023  Medication Sig   albuterol (VENTOLIN HFA) 108 (90 Base) MCG/ACT inhaler Inhale 1 puff into the lungs every 6 (six) hours as needed for wheezing or shortness of breath.   cetirizine (ZYRTEC) 10 MG tablet Take 1 tablet (10 mg total) by mouth daily.   EPINEPHrine (EPIPEN) 0.3 mg/0.3 mL SOAJ Inject 0.3 mLs (0.3 mg total) into the muscle once.   estradiol (ESTRACE) 0.1 MG/GM vaginal cream Small finger tip amount at bedtime   fluticasone (FLONASE) 50 MCG/ACT nasal spray Place into both nostrils daily.   ibuprofen (ADVIL,MOTRIN) 200 MG tablet Take 400 mg by mouth every 6 (six) hours as needed for moderate pain.    pantoprazole (PROTONIX) 40 MG tablet Take 1 tablet (40 mg total) by mouth daily.   No facility-administered  encounter medications on file as of 04/06/2023.    Allergies  Allergen Reactions   Alpha-Gal Anaphylaxis   Other Hives and Rash    Tree nuts    Pertinent ROS per HPI, otherwise unremarkable      Objective:  BP 117/85   Pulse 76   Temp (!) 96.5 F (35.8 C)   Ht 5\' 7"  (1.702 m)   Wt 153 lb 12.8 oz (69.8 kg)   LMP 04/22/2018   SpO2 100%   BMI 24.09 kg/m    Wt Readings from Last 3 Encounters:  04/06/23 153 lb 12.8 oz (69.8 kg)  02/21/23 151 lb 3.2 oz (68.6 kg)  09/26/22 147  lb 14.4 oz (67.1 kg)    Physical Exam Vitals and nursing note reviewed.  Constitutional:      General: She is not in acute distress.    Appearance: Normal appearance. She is well-developed and normal weight. She is not ill-appearing, toxic-appearing or diaphoretic.  HENT:     Head: Normocephalic and atraumatic.     Mouth/Throat:     Mouth: Mucous membranes are moist.     Pharynx: No pharyngeal swelling.  Eyes:     Extraocular Movements: Extraocular movements intact.     Conjunctiva/sclera: Conjunctivae normal.     Pupils: Pupils are equal, round, and reactive to light.  Cardiovascular:     Rate and Rhythm: Normal rate and regular rhythm.     Heart sounds: Normal heart sounds.  Pulmonary:     Effort: Pulmonary effort is normal.     Breath sounds: Normal breath sounds.  Abdominal:     General: Bowel sounds are normal. There is no distension or abdominal bruit. There are no signs of injury.     Palpations: Abdomen is soft.     Tenderness: There is abdominal tenderness in the epigastric area. There is no right CVA tenderness or left CVA tenderness.     Hernia: No hernia is present.  Musculoskeletal:     Cervical back: Neck supple.  Skin:    General: Skin is warm and dry.     Capillary Refill: Capillary refill takes less than 2 seconds.  Neurological:     General: No focal deficit present.     Mental Status: She is alert and oriented to person, place, and time.  Psychiatric:        Attention  and Perception: Attention and perception normal.        Mood and Affect: Mood normal. Affect is tearful.        Behavior: Behavior normal.        Thought Content: Thought content normal.        Judgment: Judgment normal.      Results for orders placed or performed in visit on 02/21/23  Lipid panel   Collection Time: 02/21/23 11:31 AM  Result Value Ref Range   Cholesterol, Total 185 100 - 199 mg/dL   Triglycerides 55 0 - 149 mg/dL   HDL 61 >95 mg/dL   VLDL Cholesterol Cal 11 5 - 40 mg/dL   LDL Chol Calc (NIH) 621 (H) 0 - 99 mg/dL   Chol/HDL Ratio 3.0 0.0 - 4.4 ratio       Pertinent labs & imaging results that were available during my care of the patient were reviewed by me and considered in my medical decision making.  Assessment & Plan:  Slyvia was seen today for abdominal pain and dizziness.  Diagnoses and all orders for this visit:  Generalized abdominal pain -     Anemia Profile B -     CMP14+EGFR -     Magnesium -     Fecal occult blood, imunochemical([facility]/Sun[facility]) -     pantoprazole (PROTONIX) 40 MG tablet; Take 1 tablet (40 mg total) by mouth daily. -     Ambulatory referral to Gastroenterology  Dark stools -     Anemia Profile B -     CMP14+EGFR -     Magnesium -     Fecal occult blood, imunochemical([facility]/Sunquest) -     pantoprazole (PROTONIX) 40 MG tablet; Take 1 tablet (40 mg total) by mouth daily. -     Ambulatory referral to  Gastroenterology  Dizziness -     Anemia Profile B -     CMP14+EGFR -     Magnesium -     Fecal occult blood, imunochemical([facility]/Sunquest)  Paresthesias -     Anemia Profile B -     CMP14+EGFR -     Magnesium -     Fecal occult blood, imunochemical([facility]/Sunquest)  Epigastric pain -     pantoprazole (PROTONIX) 40 MG tablet; Take 1 tablet (40 mg total) by mouth daily. -     Ambulatory referral to Gastroenterology     Assessment and Plan    Upper Gastrointestinal Bleed   Intermittent melena and upper  abdominal pain suggestive of a potential upper gastrointestinal bleed. Differential diagnosis includes peptic ulcer disease, especially given the history of heartburn and chest pain. Reports dizziness and hypotension, which may indicate anemia secondary to the bleed. Discussed risks of GI bleed, including potential for anemia and need for further evaluation. Benefits of Protonix include decreased acid production and healing of the stomach lining. Alternative approaches include endoscopy for direct visualization and potential intervention.   - Order CBC to check for anemia   - Order stool test for occult blood   - Order CMP to check liver enzymes and electrolytes   - Start Protonix (pantoprazole) daily by mouth before meals   - Refer to gastroenterology for further evaluation and possible endoscopy   - Advise follow-up with primary care provider in four weeks    Anemia   Symptoms of dizziness, lightheadedness, and hypotension (BP 90/60) suggest possible anemia, potentially secondary to gastrointestinal bleeding. Previous blood clots may complicate the clinical picture. Discussed the importance of identifying the cause of anemia to guide treatment. Benefits of early detection include timely intervention and prevention of complications.   - Order CBC to assess hemoglobin and hematocrit levels   - Check B12 levels to rule out deficiency   - Check iron studies if initial tests indicate anemia    Electrolyte Imbalance   Numbness and tingling in hands and feet could be due to electrolyte imbalances. Differential diagnosis includes hypokalemia, hypocalcemia, hypomagnesemia, or B12 deficiency. Discussed the importance of electrolyte balance for nerve function and overall health. Benefits of identifying imbalances include targeted treatment and symptom relief.   - Order CMP to check potassium, calcium, and magnesium levels   - Check B12 levels    Elevated ALT   Previous blood work indicated elevated ALT,  which could be due to various factors including medication use (e.g., acetaminophen), fatty foods, or other liver conditions. Will recheck levels to monitor. Discussed potential causes and the importance of monitoring liver function. Benefits of rechecking include early detection of liver issues and timely intervention.   - Order CMP to recheck ALT levels    General Health Maintenance   Routine follow-up and health maintenance are necessary to monitor ongoing conditions and ensure overall well-being.   - Advise follow-up with primary care provider in four weeks    Follow-up   - Advise follow-up with primary care provider in four weeks   - Refer to gastroenterology for further evaluation and possible endoscopy   - Instruct to contact the clinic if she does not hear from the gastroenterologist within a week.     Continue all other maintenance medications.  Follow up plan: Return in about 4 weeks (around 05/04/2023), or if symptoms worsen or fail to improve, for abd pain.   Continue healthy lifestyle choices, including diet (rich in fruits, vegetables, and lean  proteins, and low in salt and simple carbohydrates) and exercise (at least 30 minutes of moderate physical activity daily).   The above assessment and management plan was discussed with the patient. The patient verbalized understanding of and has agreed to the management plan. Patient is aware to call the clinic if they develop any new symptoms or if symptoms persist or worsen. Patient is aware when to return to the clinic for a follow-up visit. Patient educated on when it is appropriate to go to the emergency department.   Kari Baars, FNP-C [facility] [phone]

## 2023-04-07 LAB — ANEMIA PROFILE B
Basophils Absolute: 0 10*3/uL (ref 0.0–0.2)
Basos: 0 %
EOS (ABSOLUTE): 0.3 10*3/uL (ref 0.0–0.4)
Eos: 6 %
Ferritin: 131 ng/mL (ref 15–150)
Hematocrit: 43.5 % (ref 34.0–46.6)
Hemoglobin: 14.5 g/dL (ref 11.1–15.9)
Immature Grans (Abs): 0 10*3/uL (ref 0.0–0.1)
Immature Granulocytes: 0 %
Iron Saturation: 43 % (ref 15–55)
Iron: 123 ug/dL (ref 27–159)
Lymphocytes Absolute: 2 10*3/uL (ref 0.7–3.1)
Lymphs: 43 %
MCH: 30.7 pg (ref 26.6–33.0)
MCHC: 33.3 g/dL (ref 31.5–35.7)
MCV: 92 fL (ref 79–97)
Monocytes Absolute: 0.3 10*3/uL (ref 0.1–0.9)
Monocytes: 7 %
Neutrophils Absolute: 2.1 10*3/uL (ref 1.4–7.0)
Neutrophils: 44 %
Platelets: 270 10*3/uL (ref 150–450)
RBC: 4.73 x10E6/uL (ref 3.77–5.28)
RDW: 12.1 % (ref 11.7–15.4)
Retic Ct Pct: 1.5 % (ref 0.6–2.6)
Total Iron Binding Capacity: 283 ug/dL (ref 250–450)
UIBC: 160 ug/dL (ref 131–425)
Vitamin B-12: 596 pg/mL (ref 232–1245)
WBC: 4.7 10*3/uL (ref 3.4–10.8)

## 2023-04-07 LAB — CMP14+EGFR
ALT: 61 IU/L — ABNORMAL HIGH (ref 0–32)
AST: 48 IU/L — ABNORMAL HIGH (ref 0–40)
Albumin: 4.9 g/dL (ref 4.0–5.0)
Alkaline Phosphatase: 71 IU/L (ref 44–121)
BUN/Creatinine Ratio: 13 (ref 9–23)
BUN: 12 mg/dL (ref 6–20)
Bilirubin Total: 0.5 mg/dL (ref 0.0–1.2)
CO2: 23 mmol/L (ref 20–29)
Calcium: 9.9 mg/dL (ref 8.7–10.2)
Chloride: 102 mmol/L (ref 96–106)
Creatinine, Ser: 0.89 mg/dL (ref 0.57–1.00)
Globulin, Total: 2.6 g/dL (ref 1.5–4.5)
Glucose: 97 mg/dL (ref 70–99)
Potassium: 4.7 mmol/L (ref 3.5–5.2)
Sodium: 141 mmol/L (ref 134–144)
Total Protein: 7.5 g/dL (ref 6.0–8.5)
eGFR: 91 mL/min/{1.73_m2} (ref >59)

## 2023-04-07 LAB — MAGNESIUM: Magnesium: 2.2 mg/dL (ref 1.6–2.3)

## 2023-04-09 ENCOUNTER — Encounter (INDEPENDENT_AMBULATORY_CARE_PROVIDER_SITE_OTHER): Payer: Self-pay | Admitting: *Deleted

## 2023-04-09 ENCOUNTER — Other Ambulatory Visit: Payer: BC Managed Care – PPO

## 2023-04-09 ENCOUNTER — Encounter: Payer: Self-pay | Admitting: Family Medicine

## 2023-04-09 DIAGNOSIS — R195 Other fecal abnormalities: Secondary | ICD-10-CM | POA: Diagnosis not present

## 2023-04-09 DIAGNOSIS — R202 Paresthesia of skin: Secondary | ICD-10-CM | POA: Diagnosis not present

## 2023-04-09 DIAGNOSIS — R42 Dizziness and giddiness: Secondary | ICD-10-CM | POA: Diagnosis not present

## 2023-04-09 DIAGNOSIS — R1084 Generalized abdominal pain: Secondary | ICD-10-CM | POA: Diagnosis not present

## 2023-04-10 LAB — FECAL OCCULT BLOOD, IMMUNOCHEMICAL: Fecal Occult Bld: NEGATIVE

## 2023-04-11 ENCOUNTER — Encounter: Payer: Self-pay | Admitting: Family Medicine

## 2023-04-18 ENCOUNTER — Encounter (HOSPITAL_COMMUNITY)
Admission: RE | Admit: 2023-04-18 | Discharge: 2023-04-18 | Disposition: A | Discharge status: Home / Self Care | Payer: BC Managed Care – PPO | Source: Ambulatory Visit | Attending: Gastroenterology | Admitting: Gastroenterology

## 2023-04-18 ENCOUNTER — Encounter (INDEPENDENT_AMBULATORY_CARE_PROVIDER_SITE_OTHER): Payer: Self-pay

## 2023-04-18 ENCOUNTER — Ambulatory Visit (INDEPENDENT_AMBULATORY_CARE_PROVIDER_SITE_OTHER): Payer: BC Managed Care – PPO | Admitting: Gastroenterology

## 2023-04-18 ENCOUNTER — Encounter (INDEPENDENT_AMBULATORY_CARE_PROVIDER_SITE_OTHER): Payer: Self-pay | Admitting: Gastroenterology

## 2023-04-18 VITALS — BP 122/81 | HR 96 | Temp 98.4°F | Ht 67.0 in | Wt 152.3 lb

## 2023-04-18 DIAGNOSIS — K921 Melena: Secondary | ICD-10-CM | POA: Insufficient documentation

## 2023-04-18 DIAGNOSIS — K5900 Constipation, unspecified: Secondary | ICD-10-CM | POA: Diagnosis not present

## 2023-04-18 DIAGNOSIS — R748 Abnormal levels of other serum enzymes: Secondary | ICD-10-CM | POA: Insufficient documentation

## 2023-04-18 DIAGNOSIS — R195 Other fecal abnormalities: Secondary | ICD-10-CM | POA: Diagnosis not present

## 2023-04-18 DIAGNOSIS — R1013 Epigastric pain: Secondary | ICD-10-CM | POA: Diagnosis not present

## 2023-04-18 DIAGNOSIS — R7989 Other specified abnormal findings of blood chemistry: Secondary | ICD-10-CM

## 2023-04-18 DIAGNOSIS — R194 Change in bowel habit: Secondary | ICD-10-CM | POA: Insufficient documentation

## 2023-04-18 DIAGNOSIS — K5904 Chronic idiopathic constipation: Secondary | ICD-10-CM | POA: Insufficient documentation

## 2023-04-18 MED ORDER — POLYETHYLENE GLYCOL 3350 17 G PO PACK
17.0000 g | PACK | Freq: Two times a day (BID) | ORAL | 0 refills | Status: AC
Start: 2023-04-18 — End: 2023-07-17

## 2023-04-18 MED ORDER — PSYLLIUM 58.6 % PO PACK
1.0000 | PACK | Freq: Two times a day (BID) | ORAL | 2 refills | Status: AC
Start: 2023-04-18 — End: 2023-07-17

## 2023-04-18 NOTE — H&P (View-Only) (Signed)
 Chancellor Vanderloop Faizan Leaha Cuervo , M.D. Gastroenterology & Hepatology [facility] Gastroenterology [address] [place], [place] [address] Primary Care Physician: Deitra Morton Hummer, Nena, NP [address] [place] [address]  Chief Complaint:  Elevated liver enzymes, Black colour stools , Constipation with altered BM   History of Present Illness: Courtney Bush is a 28 y.o. female with factor V Leiden mutation , headcahes with NSAID , chronic cholecystitis status post cholecystectomy, endometriosis status post hysterectomy use who presents for evaluation of epigastric abdominal pain with black-colored stools, elevated liver enzymes and constipation  Patient reports for past 1 month she has epigastric pain radiating to the right side of her abdomen no relieving or aggravating factors.Patient reports chronic migraine and takes Advil as needed.  During the same time she has noticed black-colored stools although they are solid   Does take Pepto-Bismol The patient denies having any nausea, vomiting, fever, chills, hematemesis, jaundice, pruritus or weight loss.  Patient does report altered bowel movement with constipation and diarrhea.  Although prior to this episode of abdominal pain these altered bowel movements were without any abdominal pain  Last ZHI:wnwz Last Colonoscopy:none  FHx: neg for any gastrointestinal/liver disease, no malignancies Social: Smokes 3 to 4 cigarettes/day for the last 8 years.  Surgical: Hysterectomy and cholecystectomy  Recent labs AST 48 ALT 61 alk phos 71 T. bili 0.5 hemoglobin 14.5 platelet 270 FOBT test was negative  Past Medical History: Past Medical History:  Diagnosis Date   Allergy    factor fived deficiency   Asthma    Blood clot in vein    ? question of clot in vein per the patient, no documentation to support DVT in [facility]   Current smoker    Factor V deficiency (HCC)    Factor V Leiden carrier (HCC)    Migraine      Past Surgical History: Past Surgical History:  Procedure Laterality Date   ABDOMINAL HYSTERECTOMY  2020   total   CHOLECYSTECTOMY N/A 10/05/2017   Procedure: LAPAROSCOPIC CHOLECYSTECTOMY;  Surgeon: Kallie Manuelita BROCKS, MD;  Location: [facility];  Service: General;  Laterality: N/A;   TONSILLECTOMY     VEIN SURGERY  12/2014   WISDOM TOOTH EXTRACTION  2015    Family History: Family History  Problem Relation Age of Onset   Hypertension Father    Hyperlipidemia Father    High Cholesterol Father    Graves' disease Mother    Factor V Leiden deficiency Maternal Grandmother    Clotting disorder Maternal Grandmother    Seizures Neg Hx     Social History: Social History   Tobacco Use  Smoking Status Every Day   Current packs/day: 0.50   Average packs/day: 0.5 packs/day for 4.0 years (2.0 ttl pk-yrs)   Types: Cigarettes  Smokeless Tobacco Never   Social History   Substance and Sexual Activity  Alcohol Use Yes   Alcohol/week: 0.0 standard drinks of alcohol   Comment: occ   Social History   Substance and Sexual Activity  Drug Use No    Allergies: Allergies  Allergen Reactions   Alpha-Gal Anaphylaxis   Other Hives and Rash    Tree nuts    Medications: Current Outpatient Medications  Medication Sig Dispense Refill   albuterol  (VENTOLIN  HFA) 108 (90 Base) MCG/ACT inhaler Inhale 1 puff into the lungs every 6 (six) hours as needed for wheezing or shortness of breath. 18 g 1   cetirizine  (ZYRTEC ) 10 MG tablet Take 1  tablet (10 mg total) by mouth daily. 90 tablet 1   EPINEPHrine  (EPIPEN ) 0.3 mg/0.3 mL SOAJ Inject 0.3 mLs (0.3 mg total) into the muscle once. 1 Device 1   fluticasone  (FLONASE ) 50 MCG/ACT nasal spray Place into both nostrils daily.     ibuprofen (ADVIL,MOTRIN) 200 MG tablet Take 400 mg by mouth every 6 (six) hours as needed for moderate pain.      pantoprazole  (PROTONIX ) 40 MG tablet Take 1 tablet (40 mg total) by mouth daily. 30 tablet 3   polyethylene  glycol (MIRALAX  / GLYCOLAX ) 17 g packet Take 17 g by mouth 2 (two) times daily. 180 packet 0   psyllium (METAMUCIL) 58.6 % packet Take 1 packet by mouth 2 (two) times daily. 60 packet 2   No current facility-administered medications for this visit.    Review of Systems: GENERAL: negative for malaise, night sweats HEENT: No changes in hearing or vision, no nose bleeds or other nasal problems. NECK: Negative for lumps, goiter, pain and significant neck swelling RESPIRATORY: Negative for cough, wheezing CARDIOVASCULAR: Negative for chest pain, leg swelling, palpitations, orthopnea GI: SEE HPI MUSCULOSKELETAL: Negative for joint pain or swelling, back pain, and muscle pain. SKIN: Negative for lesions, rash HEMATOLOGY Negative for prolonged bleeding, bruising easily, and swollen nodes. ENDOCRINE: Negative for cold or heat intolerance, polyuria, polydipsia and goiter. NEURO: negative for tremor, gait imbalance, syncope and seizures. The remainder of the review of systems is noncontributory.   Physical Exam: BP 122/81 (BP Location: Left Arm, Patient Position: Sitting, Cuff Size: Large)   Pulse 96   Temp 98.4 F (36.9 C) (Temporal)   Ht 5' 7 (1.702 m)   Wt 152 lb 4.8 oz (69.1 kg)   LMP 04/22/2018   BMI 23.85 kg/m  GENERAL: The patient is AO x3, in no acute distress. HEENT: Head is normocephalic and atraumatic. EOMI are intact. Mouth is well hydrated and without lesions. NECK: Supple. No masses LUNGS: Clear to auscultation. No presence of rhonchi/wheezing/rales. Adequate chest expansion HEART: RRR, normal s1 and s2. ABDOMEN: Soft, nontender, no guarding, no peritoneal signs, and nondistended. BS +. No masses.   Imaging/Labs: as above     Latest Ref Rng & Units 04/06/2023   10:43 AM 08/14/2022    8:51 AM 09/11/2017    9:15 AM  CBC  WBC 3.4 - 10.8 x10E3/uL 4.7  4.9  4.9   Hemoglobin 11.1 - 15.9 g/dL 85.4  85.6  85.4   Hematocrit 34.0 - 46.6 % 43.5  43.6  43.8   Platelets 150 - 450  x10E3/uL 270  256  260    Lab Results  Component Value Date   IRON 123 04/06/2023   TIBC 283 04/06/2023   FERRITIN 131 04/06/2023    I personally reviewed and interpreted the available labs, imaging and endoscopic files.  Impression and Plan:  Nica Friske is a 28 y.o. female with factor V Leiden mutation , headcahes with NSAID , chronic cholecystitis status post cholecystectomy, endometriosis status post hysterectomy use who presents for evaluation of epigastric abdominal pain with black-colored stools, elevated liver enzymes and constipation  #Abdominal pain  #Black colour stools concerning for melena  This could be peptic ulcer disease given intermittent NSAID use or acid mediated or H. pylori mediated dyspepsia  Patient has been taking PPI for 2 weeks without any relief  Discussed risk indication benefit and limitation to diagnostic upper endoscopy to evaluate for any peptic ulcer disease or any other lesion explaining melena.  Patient  is agreeable   # Elevated liver enzymes Patient has hepatocellular elevation liver enzymes   No herbal medications or alcohol use does not have risk factors for MASLD  Will obtain baseline viral hepatitis profile with autoimmune serologies and complete abdominal ultrasound  If nothing explains elevation liver enzymes and it continues to persist may need MRCP in future to evaluate for in situ choledocholithiasis given abdominal pain and elevated liver enzymes  #Altered Bowel movements  This is likely chronic idiopathic constipation with overflow diarrhea.  But given young age will screen for inflammatory bowel disease with CRP and fecal calprotectin.  Given elevated liver enzymes and diarrhea also screen for celiac serologies   All questions were answered.      Taheera Thomann Faizan Mell Guia, MD Gastroenterology and Hepatology [facility] Gastroenterology   This chart has been completed using [facility] Dictation software,  and while attempts have been made to ensure accuracy , certain words and phrases may not be transcribed as intended

## 2023-04-18 NOTE — Patient Instructions (Signed)
 It was very nice to meet you today, as dicussed with will plan for the following : 1) Labwork and ultrsound  2) Ensure adequate fluid intake: Aim for 8 glasses of water daily. Follow a high fiber diet: Include foods such as dates, prunes, pears, and kiwi. Take Miralax  twice a day for the first week, then reduce to once daily thereafter. Use Metamucil twice a day.  3) Upper endoscopy

## 2023-04-18 NOTE — Progress Notes (Signed)
 Chancellor Vanderloop Faizan Leaha Cuervo , M.D. Gastroenterology & Hepatology [facility] Gastroenterology [address] [place], [place] [address] Primary Care Physician: Deitra Morton Hummer, Nena, NP [address] [place] [address]  Chief Complaint:  Elevated liver enzymes, Black colour stools , Constipation with altered BM   History of Present Illness: Courtney Bush is a 28 y.o. female with factor V Leiden mutation , headcahes with NSAID , chronic cholecystitis status post cholecystectomy, endometriosis status post hysterectomy use who presents for evaluation of epigastric abdominal pain with black-colored stools, elevated liver enzymes and constipation  Patient reports for past 1 month she has epigastric pain radiating to the right side of her abdomen no relieving or aggravating factors.Patient reports chronic migraine and takes Advil as needed.  During the same time she has noticed black-colored stools although they are solid   Does take Pepto-Bismol The patient denies having any nausea, vomiting, fever, chills, hematemesis, jaundice, pruritus or weight loss.  Patient does report altered bowel movement with constipation and diarrhea.  Although prior to this episode of abdominal pain these altered bowel movements were without any abdominal pain  Last ZHI:wnwz Last Colonoscopy:none  FHx: neg for any gastrointestinal/liver disease, no malignancies Social: Smokes 3 to 4 cigarettes/day for the last 8 years.  Surgical: Hysterectomy and cholecystectomy  Recent labs AST 48 ALT 61 alk phos 71 T. bili 0.5 hemoglobin 14.5 platelet 270 FOBT test was negative  Past Medical History: Past Medical History:  Diagnosis Date   Allergy    factor fived deficiency   Asthma    Blood clot in vein    ? question of clot in vein per the patient, no documentation to support DVT in [facility]   Current smoker    Factor V deficiency (HCC)    Factor V Leiden carrier (HCC)    Migraine      Past Surgical History: Past Surgical History:  Procedure Laterality Date   ABDOMINAL HYSTERECTOMY  2020   total   CHOLECYSTECTOMY N/A 10/05/2017   Procedure: LAPAROSCOPIC CHOLECYSTECTOMY;  Surgeon: Kallie Manuelita BROCKS, MD;  Location: [facility];  Service: General;  Laterality: N/A;   TONSILLECTOMY     VEIN SURGERY  12/2014   WISDOM TOOTH EXTRACTION  2015    Family History: Family History  Problem Relation Age of Onset   Hypertension Father    Hyperlipidemia Father    High Cholesterol Father    Graves' disease Mother    Factor V Leiden deficiency Maternal Grandmother    Clotting disorder Maternal Grandmother    Seizures Neg Hx     Social History: Social History   Tobacco Use  Smoking Status Every Day   Current packs/day: 0.50   Average packs/day: 0.5 packs/day for 4.0 years (2.0 ttl pk-yrs)   Types: Cigarettes  Smokeless Tobacco Never   Social History   Substance and Sexual Activity  Alcohol Use Yes   Alcohol/week: 0.0 standard drinks of alcohol   Comment: occ   Social History   Substance and Sexual Activity  Drug Use No    Allergies: Allergies  Allergen Reactions   Alpha-Gal Anaphylaxis   Other Hives and Rash    Tree nuts    Medications: Current Outpatient Medications  Medication Sig Dispense Refill   albuterol  (VENTOLIN  HFA) 108 (90 Base) MCG/ACT inhaler Inhale 1 puff into the lungs every 6 (six) hours as needed for wheezing or shortness of breath. 18 g 1   cetirizine  (ZYRTEC ) 10 MG tablet Take 1  tablet (10 mg total) by mouth daily. 90 tablet 1   EPINEPHrine  (EPIPEN ) 0.3 mg/0.3 mL SOAJ Inject 0.3 mLs (0.3 mg total) into the muscle once. 1 Device 1   fluticasone  (FLONASE ) 50 MCG/ACT nasal spray Place into both nostrils daily.     ibuprofen (ADVIL,MOTRIN) 200 MG tablet Take 400 mg by mouth every 6 (six) hours as needed for moderate pain.      pantoprazole  (PROTONIX ) 40 MG tablet Take 1 tablet (40 mg total) by mouth daily. 30 tablet 3   polyethylene  glycol (MIRALAX  / GLYCOLAX ) 17 g packet Take 17 g by mouth 2 (two) times daily. 180 packet 0   psyllium (METAMUCIL) 58.6 % packet Take 1 packet by mouth 2 (two) times daily. 60 packet 2   No current facility-administered medications for this visit.    Review of Systems: GENERAL: negative for malaise, night sweats HEENT: No changes in hearing or vision, no nose bleeds or other nasal problems. NECK: Negative for lumps, goiter, pain and significant neck swelling RESPIRATORY: Negative for cough, wheezing CARDIOVASCULAR: Negative for chest pain, leg swelling, palpitations, orthopnea GI: SEE HPI MUSCULOSKELETAL: Negative for joint pain or swelling, back pain, and muscle pain. SKIN: Negative for lesions, rash HEMATOLOGY Negative for prolonged bleeding, bruising easily, and swollen nodes. ENDOCRINE: Negative for cold or heat intolerance, polyuria, polydipsia and goiter. NEURO: negative for tremor, gait imbalance, syncope and seizures. The remainder of the review of systems is noncontributory.   Physical Exam: BP 122/81 (BP Location: Left Arm, Patient Position: Sitting, Cuff Size: Large)   Pulse 96   Temp 98.4 F (36.9 C) (Temporal)   Ht 5' 7 (1.702 m)   Wt 152 lb 4.8 oz (69.1 kg)   LMP 04/22/2018   BMI 23.85 kg/m  GENERAL: The patient is AO x3, in no acute distress. HEENT: Head is normocephalic and atraumatic. EOMI are intact. Mouth is well hydrated and without lesions. NECK: Supple. No masses LUNGS: Clear to auscultation. No presence of rhonchi/wheezing/rales. Adequate chest expansion HEART: RRR, normal s1 and s2. ABDOMEN: Soft, nontender, no guarding, no peritoneal signs, and nondistended. BS +. No masses.   Imaging/Labs: as above     Latest Ref Rng & Units 04/06/2023   10:43 AM 08/14/2022    8:51 AM 09/11/2017    9:15 AM  CBC  WBC 3.4 - 10.8 x10E3/uL 4.7  4.9  4.9   Hemoglobin 11.1 - 15.9 g/dL 85.4  85.6  85.4   Hematocrit 34.0 - 46.6 % 43.5  43.6  43.8   Platelets 150 - 450  x10E3/uL 270  256  260    Lab Results  Component Value Date   IRON 123 04/06/2023   TIBC 283 04/06/2023   FERRITIN 131 04/06/2023    I personally reviewed and interpreted the available labs, imaging and endoscopic files.  Impression and Plan:  Nica Friske is a 28 y.o. female with factor V Leiden mutation , headcahes with NSAID , chronic cholecystitis status post cholecystectomy, endometriosis status post hysterectomy use who presents for evaluation of epigastric abdominal pain with black-colored stools, elevated liver enzymes and constipation  #Abdominal pain  #Black colour stools concerning for melena  This could be peptic ulcer disease given intermittent NSAID use or acid mediated or H. pylori mediated dyspepsia  Patient has been taking PPI for 2 weeks without any relief  Discussed risk indication benefit and limitation to diagnostic upper endoscopy to evaluate for any peptic ulcer disease or any other lesion explaining melena.  Patient  is agreeable   # Elevated liver enzymes Patient has hepatocellular elevation liver enzymes   No herbal medications or alcohol use does not have risk factors for MASLD  Will obtain baseline viral hepatitis profile with autoimmune serologies and complete abdominal ultrasound  If nothing explains elevation liver enzymes and it continues to persist may need MRCP in future to evaluate for in situ choledocholithiasis given abdominal pain and elevated liver enzymes  #Altered Bowel movements  This is likely chronic idiopathic constipation with overflow diarrhea.  But given young age will screen for inflammatory bowel disease with CRP and fecal calprotectin.  Given elevated liver enzymes and diarrhea also screen for celiac serologies   All questions were answered.      Akina Maish Faizan Dai Apel, MD Gastroenterology and Hepatology [facility] Gastroenterology   This chart has been completed using [facility] Dictation software,  and while attempts have been made to ensure accuracy , certain words and phrases may not be transcribed as intended

## 2023-04-19 ENCOUNTER — Ambulatory Visit (HOSPITAL_COMMUNITY): Payer: Self-pay | Admitting: Certified Registered Nurse Anesthetist

## 2023-04-19 ENCOUNTER — Ambulatory Visit (HOSPITAL_COMMUNITY)
Admission: RE | Admit: 2023-04-19 | Discharge: 2023-04-19 | Disposition: A | Discharge status: Home / Self Care | Payer: BC Managed Care – PPO | Source: Ambulatory Visit | Attending: Gastroenterology | Admitting: Gastroenterology

## 2023-04-19 ENCOUNTER — Encounter (INDEPENDENT_AMBULATORY_CARE_PROVIDER_SITE_OTHER): Payer: Self-pay | Admitting: *Deleted

## 2023-04-19 ENCOUNTER — Encounter (HOSPITAL_COMMUNITY): Payer: Self-pay | Admitting: Gastroenterology

## 2023-04-19 ENCOUNTER — Encounter (HOSPITAL_COMMUNITY)
Admission: RE | Disposition: A | Discharge status: Home / Self Care | Payer: Self-pay | Source: Ambulatory Visit | Attending: Gastroenterology

## 2023-04-19 ENCOUNTER — Encounter (INDEPENDENT_AMBULATORY_CARE_PROVIDER_SITE_OTHER): Payer: Self-pay

## 2023-04-19 DIAGNOSIS — R748 Abnormal levels of other serum enzymes: Secondary | ICD-10-CM | POA: Insufficient documentation

## 2023-04-19 DIAGNOSIS — F17210 Nicotine dependence, cigarettes, uncomplicated: Secondary | ICD-10-CM | POA: Insufficient documentation

## 2023-04-19 DIAGNOSIS — K3189 Other diseases of stomach and duodenum: Secondary | ICD-10-CM | POA: Diagnosis not present

## 2023-04-19 DIAGNOSIS — G43909 Migraine, unspecified, not intractable, without status migrainosus: Secondary | ICD-10-CM | POA: Diagnosis not present

## 2023-04-19 DIAGNOSIS — D6851 Activated protein C resistance: Secondary | ICD-10-CM | POA: Insufficient documentation

## 2023-04-19 DIAGNOSIS — R109 Unspecified abdominal pain: Secondary | ICD-10-CM | POA: Diagnosis not present

## 2023-04-19 DIAGNOSIS — K2950 Unspecified chronic gastritis without bleeding: Secondary | ICD-10-CM | POA: Insufficient documentation

## 2023-04-19 DIAGNOSIS — K5900 Constipation, unspecified: Secondary | ICD-10-CM | POA: Insufficient documentation

## 2023-04-19 DIAGNOSIS — R1013 Epigastric pain: Secondary | ICD-10-CM | POA: Insufficient documentation

## 2023-04-19 DIAGNOSIS — J45909 Unspecified asthma, uncomplicated: Secondary | ICD-10-CM | POA: Diagnosis not present

## 2023-04-19 DIAGNOSIS — K279 Peptic ulcer, site unspecified, unspecified as acute or chronic, without hemorrhage or perforation: Secondary | ICD-10-CM | POA: Diagnosis not present

## 2023-04-19 DIAGNOSIS — K921 Melena: Secondary | ICD-10-CM | POA: Diagnosis not present

## 2023-04-19 DIAGNOSIS — K2970 Gastritis, unspecified, without bleeding: Secondary | ICD-10-CM

## 2023-04-19 DIAGNOSIS — Z9049 Acquired absence of other specified parts of digestive tract: Secondary | ICD-10-CM | POA: Insufficient documentation

## 2023-04-19 HISTORY — PX: ESOPHAGOGASTRODUODENOSCOPY (EGD) WITH PROPOFOL: SHX5813

## 2023-04-19 HISTORY — PX: BIOPSY: SHX5522

## 2023-04-19 LAB — HM COLONOSCOPY

## 2023-04-19 SURGERY — ESOPHAGOGASTRODUODENOSCOPY (EGD) WITH PROPOFOL
Anesthesia: General

## 2023-04-19 MED ORDER — PROPOFOL 500 MG/50ML IV EMUL
INTRAVENOUS | Status: AC
  Filled 2023-04-19: qty 50

## 2023-04-19 MED ORDER — PROPOFOL 10 MG/ML IV BOLUS
INTRAVENOUS | Status: DC | PRN
  Administered 2023-04-19: 150 mg via INTRAVENOUS
  Administered 2023-04-19: 225 ug/kg/min via INTRAVENOUS

## 2023-04-19 MED ORDER — LIDOCAINE HCL (PF) 2 % IJ SOLN
INTRAMUSCULAR | Status: DC | PRN
  Administered 2023-04-19: 50 mg via INTRADERMAL

## 2023-04-19 MED ORDER — SODIUM CHLORIDE 0.9% FLUSH: 3.0000 mL | INTRAVENOUS | Status: DC | PRN

## 2023-04-19 MED ORDER — LACTATED RINGERS IV SOLN: INTRAVENOUS | Status: DC | PRN

## 2023-04-19 MED ORDER — SODIUM CHLORIDE 0.9% FLUSH: 3.0000 mL | Freq: Two times a day (BID) | INTRAVENOUS | Status: DC

## 2023-04-19 NOTE — Interval H&P Note (Signed)
 History and Physical Interval Note:  04/19/2023 8:13 AM  Courtney Bush  has presented today for surgery, with the diagnosis of abdominal pain, peptic ulcer disease.  The various methods of treatment have been discussed with the patient and family. After consideration of risks, benefits and other options for treatment, the patient has consented to  Procedure(s) with comments: ESOPHAGOGASTRODUODENOSCOPY (EGD) WITH PROPOFOL  (N/A) - 9:15am;asa 1-2 as a surgical intervention.  The patient's history has been reviewed, patient examined, no change in status, stable for surgery.  I have reviewed the patient's chart and labs.  Questions were answered to the patient's satisfaction.     Deatrice FALCON Maybel Dambrosio

## 2023-04-19 NOTE — Anesthesia Preprocedure Evaluation (Addendum)
 Anesthesia Evaluation  Patient identified by MRN, date of birth, ID band Patient awake    Reviewed: Allergy & Precautions, H&P , NPO status , Patient's Chart, lab work & pertinent test results, reviewed documented beta blocker date and time   Airway Mallampati: II  TM Distance: >3 FB Neck ROM: full    Dental no notable dental hx. (+) Teeth Intact, Dental Advisory Given   Pulmonary asthma , Current Smoker   Pulmonary exam normal breath sounds clear to auscultation       Cardiovascular Exercise Tolerance: Good negative cardio ROS Normal cardiovascular exam Rhythm:regular Rate:Normal     Neuro/Psych  Headaches  negative psych ROS   GI/Hepatic negative GI ROS, Neg liver ROS,,,  Endo/Other  negative endocrine ROS    Renal/GU negative Renal ROS  negative genitourinary   Musculoskeletal   Abdominal   Peds  Hematology negative hematology ROS (+) Factor 5 deficiency ( Leiden )   Anesthesia Other Findings   Reproductive/Obstetrics negative OB ROS                             Anesthesia Physical Anesthesia Plan  ASA: 2  Anesthesia Plan: General and General ETT   Post-op Pain Management: Minimal or no pain anticipated   Induction: Intravenous  PONV Risk Score and Plan: Propofol  infusion  Airway Management Planned: Natural Airway and Nasal Cannula  Additional Equipment: None  Intra-op Plan:   Post-operative Plan:   Informed Consent: I have reviewed the patients History and Physical, chart, labs and discussed the procedure including the risks, benefits and alternatives for the proposed anesthesia with the patient or authorized representative who has indicated his/her understanding and acceptance.     Dental Advisory Given  Plan Discussed with: CRNA  Anesthesia Plan Comments:         Anesthesia Quick Evaluation

## 2023-04-19 NOTE — Transfer of Care (Signed)
 Immediate Anesthesia Transfer of Care Note  Patient: Courtney Bush  Procedure(s) Performed: ESOPHAGOGASTRODUODENOSCOPY (EGD) WITH PROPOFOL  BIOPSY  Patient Location: Short Stay  Anesthesia Type:General  Level of Consciousness: drowsy, patient cooperative, and responds to stimulation  Airway & Oxygen Therapy: Patient Spontanous Breathing  Post-op Assessment: Report given to RN, Post -op Vital signs reviewed and stable, Patient moving all extremities X 4, and Patient able to stick tongue midline  Post vital signs: Reviewed and stable  Last Vitals:  Vitals Value Taken Time  BP 85/51   Temp 97.9   Pulse 77   Resp 16   SpO2 98     Last Pain:  Vitals:   04/19/23 0911  PainSc: 0-No pain         Complications: No notable events documented.

## 2023-04-19 NOTE — Anesthesia Postprocedure Evaluation (Signed)
 Anesthesia Post Note  Patient: Courtney Bush  Procedure(s) Performed: ESOPHAGOGASTRODUODENOSCOPY (EGD) WITH PROPOFOL  BIOPSY  Patient location during evaluation: PACU Anesthesia Type: General Level of consciousness: awake and alert Pain management: pain level controlled Vital Signs Assessment: post-procedure vital signs reviewed and stable Respiratory status: spontaneous breathing, nonlabored ventilation, respiratory function stable and patient connected to nasal cannula oxygen Cardiovascular status: blood pressure returned to baseline and stable Postop Assessment: no apparent nausea or vomiting Anesthetic complications: no   There were no known notable events for this encounter.   Last Vitals:  Vitals:   04/19/23 0930 04/19/23 0934  BP: (!) 85/51   Pulse: 76 76  Resp: 18 20  Temp: 36.5 C   SpO2: 99%     Last Pain:  Vitals:   04/19/23 0934  TempSrc:   PainSc: Asleep                 Shelva Hetzer L Montavis Schubring

## 2023-04-19 NOTE — Op Note (Signed)
 [facility] Patient Name: Courtney Bush Procedure Date: 04/19/2023 9:05 AM MRN: [id number] Date of Birth: 05/26/1995 Attending MD: Deatrice Dine , MD, [id number] CSN: [id number] Age: 28 Admit Type: Outpatient Procedure:                Upper GI endoscopy Indications:              Epigastric abdominal pain, Melena Providers:                Deatrice Dine, MD, Crystal Page, Daphne Mulch                            Technician, Technician Referring MD:              Medicines:                Monitored Anesthesia Care Complications:            No immediate complications. Estimated Blood Loss:     Estimated blood loss was minimal. Procedure:                Pre-Anesthesia Assessment:                           - Prior to the procedure, a History and Physical                            was performed, and patient medications and                            allergies were reviewed. The patient's tolerance of                            previous anesthesia was also reviewed. The risks                            and benefits of the procedure and the sedation                            options and risks were discussed with the patient.                            All questions were answered, and informed consent                            was obtained. Prior Anticoagulants: The patient has                            taken no anticoagulant or antiplatelet agents. ASA                            Grade Assessment: II - A patient with mild systemic                            disease. After reviewing the risks and benefits,  the patient was deemed in satisfactory condition to                            undergo the procedure.                           After obtaining informed consent, the endoscope was                            passed under direct vision. Throughout the                            procedure, the patient's blood pressure, pulse, and                             oxygen saturations were monitored continuously. The                            [id number]-H190 ([id number]) scope was introduced through the                            mouth, and advanced to the second part of duodenum.                            The upper GI endoscopy was accomplished without                            difficulty. The patient tolerated the procedure                            well. Scope In: 9:18:45 AM Scope Out: 9:25:15 AM Total Procedure Duration: 0 hours 6 minutes 30 seconds  Findings:      The examined esophagus was normal.      Mildly erythematous mucosa without bleeding was found in the stomach.       Biopsies were taken with a cold forceps for histology.      The duodenal bulb and second portion of the duodenum were normal.       Biopsies for histology were taken with a cold forceps for evaluation of       celiac disease. Impression:               - Normal esophagus.                           - Erythematous mucosa in the stomach. Biopsied.                           - Normal duodenal bulb and second portion of the                            duodenum. Biopsied. Moderate Sedation:      Per Anesthesia Care Recommendation:           - Patient has a contact number available for  emergencies. The signs and symptoms of potential                            delayed complications were discussed with the                            patient. Return to normal activities tomorrow.                            Written discharge instructions were provided to the                            patient.                           - Resume previous diet.                           - Continue present medications.                           - Await pathology results.                           -protonix  40 mg, 30 min before breakfast                           - If Abdominal pain persist and labs/ultrasound                            doesnt explain symptoms would obtain  MRI/MRCP given                            elevated liver enzymes/concerns for melena and                            abdominal pain                           -If melena persist may recommend colonoscopy in                            future                           -Follow up in GI clinic Procedure Code(s):        --- Professional ---                           [phone], Esophagogastroduodenoscopy, flexible,                            transoral; with biopsy, single or multiple Diagnosis Code(s):        --- Professional ---                           K31.89, Other diseases of stomach and duodenum  R10.13, Epigastric pain                           K92.1, Melena (includes Hematochezia) CPT copyright 2022 American Medical Association. All rights reserved. The codes documented in this report are preliminary and upon coder review may  be revised to meet current compliance requirements. Deatrice Dine, MD Deatrice Dine, MD 04/19/2023 9:31:11 AM This report has been signed electronically. Number of Addenda: 0

## 2023-04-20 ENCOUNTER — Encounter (HOSPITAL_COMMUNITY): Payer: Self-pay | Admitting: Gastroenterology

## 2023-04-20 DIAGNOSIS — R194 Change in bowel habit: Secondary | ICD-10-CM | POA: Diagnosis not present

## 2023-04-20 DIAGNOSIS — K921 Melena: Secondary | ICD-10-CM | POA: Diagnosis not present

## 2023-04-20 DIAGNOSIS — R748 Abnormal levels of other serum enzymes: Secondary | ICD-10-CM | POA: Diagnosis not present

## 2023-04-20 LAB — SURGICAL PATHOLOGY

## 2023-04-23 ENCOUNTER — Encounter (INDEPENDENT_AMBULATORY_CARE_PROVIDER_SITE_OTHER): Payer: Self-pay | Admitting: Gastroenterology

## 2023-04-23 LAB — CELIAC AB TTG DGP TIGA
Antigliadin Abs, IgA: 4 U (ref 0–19)
Gliadin IgG: 3 U (ref 0–19)
IgA/Immunoglobulin A, Serum: 121 mg/dL (ref 87–352)
Tissue Transglut Ab: 6 U/mL — ABNORMAL HIGH (ref 0–5)
Transglutaminase IgA: 3 U/mL (ref 0–3)

## 2023-04-23 LAB — C-REACTIVE PROTEIN: CRP: <1 mg/L (ref 0–10)

## 2023-04-24 ENCOUNTER — Encounter (INDEPENDENT_AMBULATORY_CARE_PROVIDER_SITE_OTHER): Payer: Self-pay | Admitting: Gastroenterology

## 2023-04-24 NOTE — Progress Notes (Signed)
I reviewed the pathology results. Ann, can you send her a letter with the findings as described below please?  Thanks,  Vista Lawman, MD Gastroenterology and Hepatology [facility] Gastroenterology  ---------------------------------------------------------------------------------------------  [facility] [address], [address], [place], [place] [address] Phone:  [phone]   04/24/23 Courtney Bush, [place]   Dear Tyler Deis,  I am writing to inform you that the biopsies taken during your recent endoscopic examination showed:  Biopsies of duodenum did not demonstrate celiac disease Stomach biopsies negative for H. pylori or anything significant  Essentially this upper endoscopy with biopsy were negative to explain your abdominal pain  Also I value your feedback , so if you get a survey , please take the time to fill it out and thank you for choosing [facility]  Please call us at [phone] if you have persistent problems or have questions about your condition that have not been fully answered at this time.  Sincerely,  Vista Lawman, MD Gastroenterology and Hepatology

## 2023-04-24 NOTE — Progress Notes (Signed)
 Normal to total IgA but slight elevation in tissue transglutaminase antibody (weakly positive)  Hepatitis A immune Hep B immune and nonexposed Hep C negative HIV negative  CRP negative INR 1.0 Ferritin 95 saturation 27% Negative AMA, ASMA  Liver enzymes have normalized now  Repeat celiac serologies in 6 to 12 months  If patient continues to have abdominal pain and elevated liver enzymes may need MRI MRCP  If nothing explains elevation liver enzymes and it continues to persist may need MRCP in future to evaluate for in situ choledocholithiasis   Pending ultrasound Pending fecal calprotectin

## 2023-04-26 ENCOUNTER — Encounter (INDEPENDENT_AMBULATORY_CARE_PROVIDER_SITE_OTHER): Payer: Self-pay | Admitting: *Deleted

## 2023-04-27 LAB — COMPREHENSIVE METABOLIC PANEL
ALT: 18 [IU]/L (ref 0–32)
AST: 21 [IU]/L (ref 0–40)
Albumin: 4.6 g/dL (ref 4.0–5.0)
Alkaline Phosphatase: 75 [IU]/L (ref 44–121)
BUN/Creatinine Ratio: 14 (ref 9–23)
BUN: 10 mg/dL (ref 6–20)
Bilirubin Total: <0.2 mg/dL (ref 0.0–1.2)
CO2: 22 mmol/L (ref 20–29)
Calcium: 10.1 mg/dL (ref 8.7–10.2)
Chloride: 104 mmol/L (ref 96–106)
Creatinine, Ser: 0.73 mg/dL (ref 0.57–1.00)
Globulin, Total: 2.7 g/dL (ref 1.5–4.5)
Glucose: 85 mg/dL (ref 70–99)
Potassium: 4.8 mmol/L (ref 3.5–5.2)
Sodium: 142 mmol/L (ref 134–144)
Total Protein: 7.3 g/dL (ref 6.0–8.5)
eGFR: 116 mL/min/{1.73_m2} (ref >59)

## 2023-04-27 LAB — ANA

## 2023-04-27 LAB — IRON,TIBC AND FERRITIN PANEL
Ferritin: 95 ng/mL (ref 15–150)
Iron Saturation: 27 % (ref 15–55)
Iron: 71 ug/dL (ref 27–159)
Total Iron Binding Capacity: 262 ug/dL (ref 250–450)
UIBC: 191 ug/dL (ref 131–425)

## 2023-04-27 LAB — PROTIME-INR
INR: 1 (ref 0.9–1.2)
Prothrombin Time: 11.4 s (ref 9.1–12.0)

## 2023-04-27 LAB — ANTI-SMOOTH MUSCLE ANTIBODY, IGG: Smooth Muscle Ab: 5 U (ref 0–19)

## 2023-04-27 LAB — HEPATITIS B SURFACE ANTIGEN: Hepatitis B Surface Ag: NEGATIVE

## 2023-04-27 LAB — HEPATITIS B SURFACE ANTIBODY,QUALITATIVE: Hep B Surface Ab, Qual: REACTIVE

## 2023-04-27 LAB — HEPATITIS C ANTIBODY: Hep C Virus Ab: NONREACTIVE

## 2023-04-27 LAB — MITOCHONDRIAL ANTIBODIES

## 2023-04-27 LAB — HEPATITIS A ANTIBODY, TOTAL: hep A Total Ab: POSITIVE — AB

## 2023-04-27 LAB — HIV ANTIBODY (ROUTINE TESTING W REFLEX): HIV Screen 4th Generation wRfx: NONREACTIVE

## 2023-04-27 LAB — HEPATITIS B CORE ANTIBODY, TOTAL: Hep B Core Total Ab: NEGATIVE

## 2023-05-10 ENCOUNTER — Ambulatory Visit (INDEPENDENT_AMBULATORY_CARE_PROVIDER_SITE_OTHER): Payer: BC Managed Care – PPO | Admitting: Gastroenterology

## 2023-05-21 ENCOUNTER — Encounter (INDEPENDENT_AMBULATORY_CARE_PROVIDER_SITE_OTHER): Payer: Self-pay | Admitting: Gastroenterology

## 2023-05-21 ENCOUNTER — Ambulatory Visit (INDEPENDENT_AMBULATORY_CARE_PROVIDER_SITE_OTHER): Payer: BC Managed Care – PPO | Admitting: Gastroenterology

## 2023-05-21 VITALS — BP 115/80 | HR 89 | Temp 97.5°F | Ht 67.0 in | Wt 152.0 lb

## 2023-05-21 DIAGNOSIS — R748 Abnormal levels of other serum enzymes: Secondary | ICD-10-CM

## 2023-05-21 DIAGNOSIS — Z09 Encounter for follow-up examination after completed treatment for conditions other than malignant neoplasm: Secondary | ICD-10-CM | POA: Diagnosis not present

## 2023-05-21 DIAGNOSIS — R768 Other specified abnormal immunological findings in serum: Secondary | ICD-10-CM | POA: Insufficient documentation

## 2023-05-21 DIAGNOSIS — K5904 Chronic idiopathic constipation: Secondary | ICD-10-CM

## 2023-05-21 DIAGNOSIS — R1013 Epigastric pain: Secondary | ICD-10-CM | POA: Insufficient documentation

## 2023-05-21 NOTE — Patient Instructions (Signed)
 It was very nice to meet you today, as dicussed with will plan for the following :  1) Labs

## 2023-05-21 NOTE — Progress Notes (Signed)
 Vista Lawman , M.D. Gastroenterology & Hepatology [facility] Gastroenterology [address] [place], [place] [address] Primary Care Physician: Evern Bio, Dois Davenport, NP [address] [place] [address]  Chief Complaint:  Elevated liver enzymes, Black colour stools , Constipation with altered BM   History of Present Illness: Courtney Bush is a 28 y.o. female with factor V Leiden mutation , headcahes with NSAID , chronic cholecystitis status post cholecystectomy, endometriosis status post hysterectomy use who is here for follow up on epigastric abdominal pain with black-colored stools, elevated liver enzymes and constipation.  Patient was last seen in February by myself where she underwent lab work and upper endoscopy.  Since then patient symptoms has resolved completely.  Patient is taking Metamucil daily and reports eating healthy choices.  Although she reports not eating bread helps her abdominal discomfort which has resolved now.  Patient has chronic migraine and was previously taking Advil or black-colored stool, which has resolved now the patient denies having any nausea, vomiting, fever, chills, hematemesis, jaundice, pruritus or weight loss.  Altered bowel movements with constipation and diarrhea also has resolved with Metamucil and high-fiber diet  Last GEX:BMWU Last Colonoscopy:none  FHx: neg for any gastrointestinal/liver disease, no malignancies Social: Smokes 3 to 4 cigarettes/day for the last 8 years.  Surgical: Hysterectomy and cholecystectomy  Recent labs AST 48 ALT 61 alk phos 71 T. bili 0.5 hemoglobin 14.5 platelet 270 FOBT test was negative  Past Medical History: Past Medical History:  Diagnosis Date   Allergy    factor fived deficiency   Asthma    Blood clot in vein    ? question of clot in vein per the patient, no documentation to support DVT in [facility]   Current smoker    Factor V deficiency (HCC)     Factor V Leiden carrier (HCC)    Migraine     Past Surgical History: Past Surgical History:  Procedure Laterality Date   ABDOMINAL HYSTERECTOMY  2020   total   BIOPSY  04/19/2023   Procedure: BIOPSY;  Surgeon: Franky Macho, MD;  Location: [facility];  Service: Endoscopy;;   CHOLECYSTECTOMY N/A 10/05/2017   Procedure: LAPAROSCOPIC CHOLECYSTECTOMY;  Surgeon: Lucretia Roers, MD;  Location: [facility];  Service: General;  Laterality: N/A;   ESOPHAGOGASTRODUODENOSCOPY (EGD) WITH PROPOFOL N/A 04/19/2023   Procedure: ESOPHAGOGASTRODUODENOSCOPY (EGD) WITH PROPOFOL;  Surgeon: Franky Macho, MD;  Location: [facility];  Service: Endoscopy;  Laterality: N/A;  9:15am;asa 1-2   TONSILLECTOMY     VEIN SURGERY  12/2014   WISDOM TOOTH EXTRACTION  2015    Family History: Family History  Problem Relation Age of Onset   Hypertension Father    Hyperlipidemia Father    High Cholesterol Father    Graves' disease Mother    Factor V Leiden deficiency Maternal Grandmother    Clotting disorder Maternal Grandmother    Seizures Neg Hx     Social History: Social History   Tobacco Use  Smoking Status Every Day   Current packs/day: 0.50   Average packs/day: 0.5 packs/day for 4.0 years (2.0 ttl pk-yrs)   Types: Cigarettes  Smokeless Tobacco Never   Social History   Substance and Sexual Activity  Alcohol Use Yes   Alcohol/week: 0.0 standard drinks of alcohol   Comment: occ   Social History   Substance and Sexual Activity  Drug Use No    Allergies: Allergies  Allergen Reactions   Alpha-Gal  Anaphylaxis    05/21/23 states she has been eating red meat and has not had any issues.    Other Hives and Rash    Tree nuts    Medications: Current Outpatient Medications  Medication Sig Dispense Refill   albuterol (VENTOLIN HFA) 108 (90 Base) MCG/ACT inhaler Inhale 1 puff into the lungs every 6 (six) hours as needed for wheezing or shortness of breath. 18 g 1   cetirizine (ZYRTEC) 10 MG  tablet Take 1 tablet (10 mg total) by mouth daily. 90 tablet 1   EPINEPHrine (EPIPEN) 0.3 mg/0.3 mL SOAJ Inject 0.3 mLs (0.3 mg total) into the muscle once. 1 Device 1   fluticasone (FLONASE) 50 MCG/ACT nasal spray Place into both nostrils daily.     pantoprazole (PROTONIX) 40 MG tablet Take 1 tablet (40 mg total) by mouth daily. 30 tablet 3   polyethylene glycol (MIRALAX / GLYCOLAX) 17 g packet Take 17 g by mouth 2 (two) times daily. 180 packet 0   psyllium (METAMUCIL) 58.6 % packet Take 1 packet by mouth 2 (two) times daily. 60 packet 2   No current facility-administered medications for this visit.    Review of Systems: GENERAL: negative for malaise, night sweats HEENT: No changes in hearing or vision, no nose bleeds or other nasal problems. NECK: Negative for lumps, goiter, pain and significant neck swelling RESPIRATORY: Negative for cough, wheezing CARDIOVASCULAR: Negative for chest pain, leg swelling, palpitations, orthopnea GI: SEE HPI MUSCULOSKELETAL: Negative for joint pain or swelling, back pain, and muscle pain. SKIN: Negative for lesions, rash HEMATOLOGY Negative for prolonged bleeding, bruising easily, and swollen nodes. ENDOCRINE: Negative for cold or heat intolerance, polyuria, polydipsia and goiter. NEURO: negative for tremor, gait imbalance, syncope and seizures. The remainder of the review of systems is noncontributory.   Physical Exam: BP 115/80   Pulse 89   Temp (!) 97.5 F (36.4 C)   Ht 5\' 7"  (1.702 m)   Wt 152 lb (68.9 kg)   LMP 04/22/2018   BMI 23.81 kg/m  GENERAL: The patient is AO x3, in no acute distress. HEENT: Head is normocephalic and atraumatic. EOMI are intact. Mouth is well hydrated and without lesions. NECK: Supple. No masses LUNGS: Clear to auscultation. No presence of rhonchi/wheezing/rales. Adequate chest expansion HEART: RRR, normal s1 and s2. ABDOMEN: Soft, nontender, no guarding, no peritoneal signs, and nondistended. BS +. No  masses.   Imaging/Labs: as above     Latest Ref Rng & Units 04/06/2023   10:43 AM 08/14/2022    8:51 AM 09/11/2017    9:15 AM  CBC  WBC 3.4 - 10.8 x10E3/uL 4.7  4.9  4.9   Hemoglobin 11.1 - 15.9 g/dL 04.5  40.9  81.1   Hematocrit 34.0 - 46.6 % 43.5  43.6  43.8   Platelets 150 - 450 x10E3/uL 270  256  260    Lab Results  Component Value Date   IRON 71 04/20/2023   TIBC 262 04/20/2023   FERRITIN 95 04/20/2023    I personally reviewed and interpreted the available labs, imaging and endoscopic files.  Normal to total IgA but slight elevation in tissue transglutaminase antibody (weakly positive)   Hepatitis A immune Hep B immune and nonexposed Hep C negative HIV negative   CRP negative INR 1.0 Ferritin 95 saturation 27% Negative AMA, ASMA   Liver enzymes have normalized now       Impression and Plan:  Courtney Bush is a 28 y.o. female with factor V  Leiden mutation , headcahes with NSAID , chronic cholecystitis status post cholecystectomy, endometriosis status post hysterectomy use who is here for follow up on epigastric abdominal pain with black-colored stools, elevated liver enzymes and constipation.   # Elevated liver enzymes- resolved  Patient had hepatocellular elevation liver enzymes   No herbal medications or alcohol use does not have risk factors for MASLD Complete blood work as above with negative viral hepatitis profile and autoimmune serologies.  If patient continues to have persistent elevation liver enzymes may need MRCP in future to evaluate for in situ choledocholithiasis given abdominal pain and elevated liver enzymes  #Abdominal pain -resolved  #Black colour stools concerning for melena-resolved   This could have been peptic ulcer in setting of Advil use or black tarry stools could be because of Pepto-Bismol use  No further black-colored stools after PPI use and essentially negative upper endoscopy  Although patient reports her abdominal pain  resolved after she stopped eating bread Patient had slightly elevated TTG IgA which could be nonspecific with negative small bowel biopsies  Will obtain HLA genotype to completely rule out celiac as small bowel biopsies and celiac disease could be patchy  #Altered Bowel movements-improved   This was likely chronic idiopathic constipation with overflow diarrhea.  After Metamucil and healthy lifestyle changes patient bowel movements are regular now  All questions were answered.      Vista Lawman, MD Gastroenterology and Hepatology [facility] Gastroenterology   This chart has been completed using [facility] Dictation software, and while attempts have been made to ensure accuracy , certain words and phrases may not be transcribed as intended

## 2023-06-26 ENCOUNTER — Encounter: Payer: Self-pay | Admitting: *Deleted

## 2023-08-04 DIAGNOSIS — H1013 Acute atopic conjunctivitis, bilateral: Secondary | ICD-10-CM | POA: Diagnosis not present

## 2023-08-22 ENCOUNTER — Ambulatory Visit: Payer: BC Managed Care – PPO | Admitting: Nurse Practitioner

## 2023-08-29 ENCOUNTER — Ambulatory Visit: Admitting: Nurse Practitioner

## 2023-08-29 VITALS — BP 108/76 | HR 79 | Temp 97.6°F | Ht 67.0 in | Wt 149.6 lb

## 2023-08-29 DIAGNOSIS — Z716 Tobacco abuse counseling: Secondary | ICD-10-CM

## 2023-08-29 DIAGNOSIS — K2970 Gastritis, unspecified, without bleeding: Secondary | ICD-10-CM

## 2023-08-29 DIAGNOSIS — Z720 Tobacco use: Secondary | ICD-10-CM

## 2023-08-29 DIAGNOSIS — J302 Other seasonal allergic rhinitis: Secondary | ICD-10-CM

## 2023-08-29 DIAGNOSIS — J4520 Mild intermittent asthma, uncomplicated: Secondary | ICD-10-CM

## 2023-08-29 DIAGNOSIS — K2990 Gastroduodenitis, unspecified, without bleeding: Secondary | ICD-10-CM

## 2023-08-29 NOTE — Progress Notes (Signed)
 Established Patient Office Visit  Subjective  Patient ID: Courtney Bush, female    DOB: [birth year]  Age: 28 y.o. MRN: [id number]  Chief Complaint  Patient presents with   Medical Management of Chronic Issues    6 month    HPI Courtney Bush 28 year old female presenting 15 2025 for 44-month follow-up for chronic disease management GERD, Follow up: The patient was last seen for GERD 6 months ago. Changes made since that visit include pantoprazole  40 mg daily. She reports excellent compliance with treatment. She is not having side effects. . She IS experiencing heartburn. She is NOT experiencing belching, bilious reflux, cough, or deep pressure at base of neck Had endoscopy in Feb, negative h-pilori, Biopsies of duodenum did not demonstrate celiac disease Stomach biopsies negative for H. Pylori. Celiac enzyme was slightly elevated   Liver enzymes have normalized now . Repeat celiac serologies in 6 to 12 months   Allergic Rhinitis: Courtney Bush is here for evaluation of possible allergic rhinitis. Patient's symptoms include itchy nose and nasal congestion. These symptoms are perennial with seasonal exacerbation. Current triggers include exposure to no known precipitant. The patient has been suffering from these symptoms for approximately awhile. The patient has tried prescription antihistamines with excellent relief of symptoms. Immunotherapy has been used with good relief of symptoms. The patient has never had nasal polyps. The patient has no history of asthma. The patient does not suffer from frequent sinopulmonary infections. The patient has not had sinus surgery in the past. The patient has no history of eczema.   Asthma Follow-up: She has previously been evaluated here for asthma and presents for an asthma follow-up; she is not currently in exacerbation. Symptoms currently include dyspnea, non-productive cough, and wheezing and occur less than 2x/month. Observed  precipitants include no identifiable factor.  Current limitations in activity from asthma: none.  Number of days of school or work missed in the last month: 0. Number of Emergency Department visits in the previous month: none. The patient reports adherence to this regimen   Smokes 1.5 pack weekly  for 10 yrs Patient Active Problem List   Diagnosis Date Noted   Epigastric pain 05/21/2023   Positive autoantibody screening for celiac disease 05/21/2023   Gastritis and gastroduodenitis 04/19/2023   Altered bowel habits 04/18/2023   Hematochezia 04/18/2023   Elevated liver enzymes 04/18/2023   Chronic idiopathic constipation 04/18/2023   Encounter for immunization 02/22/2023   Lipids abnormal 02/22/2023   Tobacco use disorder, continuous 08/24/2022   Encounter for annual physical examination excluding gynecological examination in a patient older than 17 years 08/24/2022   Encounter to establish care 08/14/2022   Allergic asthma 08/14/2022   Family history of diabetes mellitus 08/14/2022   Family history of cardiac disorder in mother 08/14/2022   Encounter for smoking cessation counseling 08/14/2022   Current smoker 08/14/2022   History of total hysterectomy 08/14/2022   Endometriosis 10/27/2020   Tobacco use 09/02/2018   Gallbladder sludge 10/02/2017   Cervical lymphadenopathy 01/23/2017   Factor V Leiden (HCC) 01/17/2017   Allergy to alpha-gal 01/17/2017   Varicose veins of right lower extremity with complications 09/06/2015   Varicose veins of right lower extremity 12/28/2014   Migraine 12/03/2014   Tree nut allergy 12/03/2014   Allergic rhinitis 12/03/2014   Varicose veins of leg with complications 10/27/2014   Past Medical History:  Diagnosis Date   Allergy    factor fived deficiency   Asthma    Blood clot in  vein    ? question of clot in vein per the patient, no documentation to support DVT in [facility]   Current smoker    Factor V deficiency (HCC)    Factor V Leiden  carrier (HCC)    Migraine    Past Surgical History:  Procedure Laterality Date   ABDOMINAL HYSTERECTOMY  2020   total   BIOPSY  04/19/2023   Procedure: BIOPSY;  Surgeon: Hargis Lias, MD;  Location: [facility];  Service: Endoscopy;;   CHOLECYSTECTOMY N/A 10/05/2017   Procedure: LAPAROSCOPIC CHOLECYSTECTOMY;  Surgeon: Awilda Bogus, MD;  Location: [facility];  Service: General;  Laterality: N/A;   ESOPHAGOGASTRODUODENOSCOPY (EGD) WITH PROPOFOL  N/A 04/19/2023   Procedure: ESOPHAGOGASTRODUODENOSCOPY (EGD) WITH PROPOFOL ;  Surgeon: Hargis Lias, MD;  Location: [facility];  Service: Endoscopy;  Laterality: N/A;  9:15am;asa 1-2   TONSILLECTOMY     VEIN SURGERY  12/2014   WISDOM TOOTH EXTRACTION  2015   Social History   Tobacco Use   Smoking status: Every Day    Current packs/day: 0.50    Average packs/day: 0.5 packs/day for 4.0 years (2.0 ttl pk-yrs)    Types: Cigarettes   Smokeless tobacco: Never  Vaping Use   Vaping status: Former  Substance Use Topics   Alcohol use: Yes    Alcohol/week: 0.0 standard drinks of alcohol    Comment: occ   Drug use: No   Social History   Socioeconomic History   Marital status: Married    Spouse name: Not on file   Number of children: 0   Years of education: Not on file   Highest education level: Bachelor's degree (e.g., BA, AB, BS)  Occupational History   Not on file  Tobacco Use   Smoking status: Every Day    Current packs/day: 0.50    Average packs/day: 0.5 packs/day for 4.0 years (2.0 ttl pk-yrs)    Types: Cigarettes   Smokeless tobacco: Never  Vaping Use   Vaping status: Former  Substance and Sexual Activity   Alcohol use: Yes    Alcohol/week: 0.0 standard drinks of alcohol    Comment: occ   Drug use: No   Sexual activity: Yes    Birth control/protection: Condom, Surgical    Comment: total hysterectomy  Other Topics Concern   Not on file  Social History Narrative   Not on file   Social Drivers of Health    Financial Resource Strain: Low Risk  (08/29/2023)   Overall Financial Resource Strain (CARDIA)    Difficulty of Paying Living Expenses: Not hard at all  Food Insecurity: No Food Insecurity (08/29/2023)   Hunger Vital Sign    Worried About Running Out of Food in the Last Year: Never true    Ran Out of Food in the Last Year: Never true  Transportation Needs: No Transportation Needs (08/29/2023)   PRAPARE - Administrator, Civil Service (Medical): No    Lack of Transportation (Non-Medical): No  Physical Activity: Sufficiently Active (08/29/2023)   Exercise Vital Sign    Days of Exercise per Week: 5 days    Minutes of Exercise per Session: 30 min  Stress: No Stress Concern Present (08/29/2023)   [employer] of Occupational Health - Occupational Stress Questionnaire    Feeling of Stress: Not at all  Social Connections: Moderately Integrated (08/29/2023)   Social Connection and Isolation Panel    Frequency of Communication with Friends and Family: More than three times a week  Frequency of Social Gatherings with Friends and Family: More than three times a week    Attends Religious Services: 1 to 4 times per year    Active Member of [employer] or Organizations: No    Attends Engineer, structural: Not on file    Marital Status: Married  Catering manager Violence: Not At Risk (08/14/2022)   Humiliation, Afraid, Rape, and Kick questionnaire    Fear of Current or Ex-Partner: No    Emotionally Abused: No    Physically Abused: No    Sexually Abused: No   Family Status  Relation Name Status   Father  Alive   Mother  Alive   MGM  Alive   PGF  Deceased   PGM  Deceased   MGF  Alive   Neg Hx  (Not Specified)  No partnership data on file   Family History  Problem Relation Age of Onset   Hypertension Father    Hyperlipidemia Father    High Cholesterol Father    Graves' disease Mother    Factor V Leiden deficiency Maternal Grandmother    Clotting disorder Maternal  Grandmother    Seizures Neg Hx    Allergies  Allergen Reactions   Alpha-Gal Anaphylaxis    05/21/23 states she has been eating red meat and has not had any issues.    Other Hives and Rash    Tree nuts      Review of Systems  Constitutional:  Negative for chills and fever.  HENT:  Negative for congestion and sore throat.   Respiratory:  Negative for cough and shortness of breath.   Cardiovascular:  Negative for chest pain and leg swelling.  Musculoskeletal:  Negative for falls.  Skin:  Negative for itching and rash.  Neurological:  Negative for dizziness and headaches.   Negative unless indicated in HPI   Objective:     BP 108/76   Pulse 79   Temp 97.6 F (36.4 C) (Temporal)   Ht 5' 7 (1.702 m)   Wt 149 lb 9.6 oz (67.9 kg)   LMP 04/22/2018   SpO2 97%   BMI 23.43 kg/m  BP Readings from Last 3 Encounters:  08/29/23 108/76  05/21/23 115/80  04/19/23 (!) 85/51   Wt Readings from Last 3 Encounters:  08/29/23 149 lb 9.6 oz (67.9 kg)  05/21/23 152 lb (68.9 kg)  04/19/23 152 lb 1.9 oz (69 kg)      Physical Exam Vitals and nursing note reviewed.  Constitutional:      General: She is not in acute distress. HENT:     Head: Normocephalic and atraumatic.     Nose: Nose normal.     Mouth/Throat:     Mouth: Mucous membranes are moist.   Eyes:     General: No scleral icterus.    Extraocular Movements: Extraocular movements intact.     Conjunctiva/sclera: Conjunctivae normal.     Pupils: Pupils are equal, round, and reactive to light.    Cardiovascular:     Heart sounds: Normal heart sounds.  Pulmonary:     Effort: Pulmonary effort is normal.     Breath sounds: Normal breath sounds. No wheezing.   Musculoskeletal:        General: Normal range of motion.     Right lower leg: No edema.     Left lower leg: No edema.   Skin:    General: Skin is warm and dry.     Findings: No rash.   Neurological:  Mental Status: She is alert and oriented to person, place,  and time.   Psychiatric:        Mood and Affect: Mood normal.        Behavior: Behavior normal.        Thought Content: Thought content normal.        Judgment: Judgment normal.      No results found for any visits on 08/29/23.  Last CBC Lab Results  Component Value Date   WBC 4.7 04/06/2023   HGB 14.5 04/06/2023   HCT 43.5 04/06/2023   MCV 92 04/06/2023   MCH 30.7 04/06/2023   RDW 12.1 04/06/2023   PLT 270 04/06/2023   Last metabolic panel Lab Results  Component Value Date   GLUCOSE 85 04/20/2023   NA 142 04/20/2023   K 4.8 04/20/2023   CL 104 04/20/2023   CO2 22 04/20/2023   BUN 10 04/20/2023   CREATININE 0.73 04/20/2023   EGFR 116 04/20/2023   CALCIUM 10.1 04/20/2023   PROT 7.3 04/20/2023   ALBUMIN 4.6 04/20/2023   LABGLOB 2.7 04/20/2023   AGRATIO 2.1 08/14/2022   BILITOT <0.2 04/20/2023   ALKPHOS 75 04/20/2023   AST 21 04/20/2023   ALT 18 04/20/2023   ANIONGAP 6 09/11/2017   Last lipids Lab Results  Component Value Date   CHOL 185 02/21/2023   HDL 61 02/21/2023   LDLCALC 113 (H) 02/21/2023   TRIG 55 02/21/2023   CHOLHDL 3.0 02/21/2023   Last hemoglobin A1c Lab Results  Component Value Date   HGBA1C 4.9 08/14/2022   Last thyroid  functions Lab Results  Component Value Date   TSH 1.180 08/14/2022   T4TOTAL 8.7 02/14/2017        Assessment & Plan:  Seasonal allergic rhinitis, unspecified trigger  Tobacco use  Encounter for smoking cessation counseling  Mild intermittent extrinsic asthma without complication  Gastritis and gastroduodenitis  Courtney Bush is a 28 yrs old Caucasian female seen today fro chronic diseases management, no acute distress Gerd: continue Pantoprazole  daily, no refill needed Asthma: well control with ventolin , no refill needed Allergic rhinitis: well control with Zyrtec  and Flonase , no refill needed Continue healthy lifestyle choices, including diet (rich in fruits, vegetables, and lean proteins, and low in salt and  simple carbohydrates) and exercise (at least 30 minutes of moderate physical activity daily).     The above assessment and management plan was discussed with the patient. The patient verbalized understanding of and has agreed to the management plan. Patient is aware to call the clinic if they develop any new symptoms or if symptoms persist or worsen. Patient is aware when to return to the clinic for a follow-up visit. Patient educated on when it is appropriate to go to the emergency department.  Return in about 6 months (around 02/28/2024) for chronic Diseases management.    Taron Conrey [place] Thompson, DNP [facility] [address] [place], [place] [address] [phone]    Note: This document was prepared by [employer] voice dictation technology and any errors that results from this process are unintentional.

## 2023-09-04 DIAGNOSIS — H60312 Diffuse otitis externa, left ear: Secondary | ICD-10-CM | POA: Diagnosis not present

## 2023-09-18 ENCOUNTER — Emergency Department (HOSPITAL_COMMUNITY)
Admission: EM | Admit: 2023-09-18 | Discharge: 2023-09-19 | Disposition: A | Discharge status: Home / Self Care | Attending: Emergency Medicine | Admitting: Emergency Medicine

## 2023-09-18 ENCOUNTER — Encounter (HOSPITAL_COMMUNITY): Payer: Self-pay

## 2023-09-18 ENCOUNTER — Other Ambulatory Visit: Payer: Self-pay

## 2023-09-18 DIAGNOSIS — M79662 Pain in left lower leg: Secondary | ICD-10-CM | POA: Insufficient documentation

## 2023-09-18 DIAGNOSIS — R0789 Other chest pain: Secondary | ICD-10-CM | POA: Diagnosis not present

## 2023-09-18 DIAGNOSIS — R918 Other nonspecific abnormal finding of lung field: Secondary | ICD-10-CM | POA: Diagnosis not present

## 2023-09-18 DIAGNOSIS — R0602 Shortness of breath: Secondary | ICD-10-CM | POA: Insufficient documentation

## 2023-09-18 DIAGNOSIS — Z862 Personal history of diseases of the blood and blood-forming organs and certain disorders involving the immune mechanism: Secondary | ICD-10-CM

## 2023-09-18 DIAGNOSIS — R000 Tachycardia, unspecified: Secondary | ICD-10-CM | POA: Diagnosis not present

## 2023-09-18 DIAGNOSIS — R079 Chest pain, unspecified: Secondary | ICD-10-CM | POA: Diagnosis not present

## 2023-09-18 DIAGNOSIS — J984 Other disorders of lung: Secondary | ICD-10-CM | POA: Diagnosis not present

## 2023-09-18 NOTE — ED Triage Notes (Signed)
 Pt bib [facility] from home. Pt has hx of DVT and feels like she is having another clot in her left leg and chest. Per EMS pt seemed anxious upon arrival and was put on O2 for comfort.

## 2023-09-19 ENCOUNTER — Emergency Department (HOSPITAL_COMMUNITY)

## 2023-09-19 DIAGNOSIS — J984 Other disorders of lung: Secondary | ICD-10-CM | POA: Diagnosis not present

## 2023-09-19 DIAGNOSIS — R918 Other nonspecific abnormal finding of lung field: Secondary | ICD-10-CM | POA: Diagnosis not present

## 2023-09-19 LAB — BASIC METABOLIC PANEL WITH GFR
Anion gap: 10 (ref 5–15)
BUN: 11 mg/dL (ref 6–20)
CO2: 26 mmol/L (ref 22–32)
Calcium: 9.2 mg/dL (ref 8.9–10.3)
Chloride: 104 mmol/L (ref 98–111)
Creatinine, Ser: 0.69 mg/dL (ref 0.44–1.00)
GFR, Estimated: >60 mL/min (ref >60)
Glucose, Bld: 106 mg/dL — ABNORMAL HIGH (ref 70–99)
Potassium: 3.3 mmol/L — ABNORMAL LOW (ref 3.5–5.1)
Sodium: 140 mmol/L (ref 135–145)

## 2023-09-19 LAB — CBC WITH DIFFERENTIAL/PLATELET
Abs Immature Granulocytes: 0.03 K/uL (ref 0.00–0.07)
Basophils Absolute: 0 K/uL (ref 0.0–0.1)
Basophils Relative: 0 %
Eosinophils Absolute: 0.4 K/uL (ref 0.0–0.5)
Eosinophils Relative: 6 %
HCT: 38.1 % (ref 36.0–46.0)
Hemoglobin: 12.9 g/dL (ref 12.0–15.0)
Immature Granulocytes: 0 %
Lymphocytes Relative: 36 %
Lymphs Abs: 2.5 K/uL (ref 0.7–4.0)
MCH: 31.7 pg (ref 26.0–34.0)
MCHC: 33.9 g/dL (ref 30.0–36.0)
MCV: 93.6 fL (ref 80.0–100.0)
Monocytes Absolute: 0.5 K/uL (ref 0.1–1.0)
Monocytes Relative: 7 %
Neutro Abs: 3.5 K/uL (ref 1.7–7.7)
Neutrophils Relative %: 51 %
Platelets: 240 K/uL (ref 150–400)
RBC: 4.07 MIL/uL (ref 3.87–5.11)
RDW: 11.9 % (ref 11.5–15.5)
WBC: 7 K/uL (ref 4.0–10.5)
nRBC: 0 % (ref 0.0–0.2)

## 2023-09-19 LAB — D-DIMER, QUANTITATIVE: D-Dimer, Quant: 0.3 ug{FEU}/mL (ref 0.00–0.50)

## 2023-09-19 LAB — HCG, SERUM, QUALITATIVE: Preg, Serum: NEGATIVE

## 2023-09-19 MED ORDER — IOHEXOL 350 MG/ML SOLN
75.0000 mL | Freq: Once | INTRAVENOUS | Status: AC | PRN
  Administered 2023-09-19: 75 mL via INTRAVENOUS

## 2023-09-19 NOTE — Discharge Instructions (Signed)
 Return to the emergency department for worsening pain, difficulty breathing, high fevers, or for other new and concerning symptoms.

## 2023-09-19 NOTE — ED Provider Notes (Signed)
 [place] EMERGENCY DEPARTMENT AT [facility] Provider Note   CSN: [id number] Arrival date & time: 09/18/23  2334     Patient presents with: Leg Pain   Courtney Bush is a 28 y.o. female.   Patient is a 28 year old female with past medical history of factor V Leiden, varicose veins, hysterectomy.  Patient presenting today for evaluation of chest discomfort.  She reports having discomfort to her left calf and the back of her left knee for the past several days.  While lying in bed this evening, she experienced chest pain to the right side of her chest and radiating into the right upper back.  She reports feeling short of breath and is concerned she may have a blood clot.  She denies any fevers or chills.  No recent injury or trauma.  No cough.       Prior to Admission medications   Medication Sig Start Date End Date Taking? Authorizing Provider  albuterol  (VENTOLIN  HFA) 108 (90 Base) MCG/ACT inhaler Inhale 1 puff into the lungs every 6 (six) hours as needed for wheezing or shortness of breath. 08/14/22   [facility] [place] Sebastian Pool, NP  cetirizine  (ZYRTEC ) 10 MG tablet Take 1 tablet (10 mg total) by mouth daily. 08/14/22   [place] Thompson, Sandra, NP  EPINEPHrine  (EPIPEN ) 0.3 mg/0.3 mL SOAJ Inject 0.3 mLs (0.3 mg total) into the muscle once. 10/04/12   Eldonna Elspeth PARAS, MD  fluticasone  (FLONASE ) 50 MCG/ACT nasal spray Place into both nostrils daily.    [provider]  pantoprazole  (PROTONIX ) 40 MG tablet Take 1 tablet (40 mg total) by mouth daily. 04/06/23   Severa Rock HERO, FNP    Allergies: Alpha-gal and Other    Review of Systems  All other systems reviewed and are negative.   Updated Vital Signs BP 125/78   Pulse 93   Temp 98.4 F (36.9 C) (Oral)   Resp (!) 21   Ht 5' 7 (1.702 m)   Wt 67.8 kg   LMP 04/22/2018   SpO2 100%   BMI 23.41 kg/m   Physical Exam Vitals and nursing note reviewed.  Constitutional:      General: She is not in acute  distress.    Appearance: She is well-developed. She is not diaphoretic.  HENT:     Head: Normocephalic and atraumatic.  Cardiovascular:     Rate and Rhythm: Normal rate and regular rhythm.     Heart sounds: No murmur heard.    No friction rub. No gallop.  Pulmonary:     Effort: Pulmonary effort is normal. No respiratory distress.     Breath sounds: Normal breath sounds. No wheezing.  Abdominal:     General: Bowel sounds are normal. There is no distension.     Palpations: Abdomen is soft.     Tenderness: There is no abdominal tenderness.  Musculoskeletal:        General: No swelling or tenderness. Normal range of motion.     Cervical back: Normal range of motion and neck supple.     Right lower leg: No edema.     Left lower leg: No edema.  Skin:    General: Skin is warm and dry.  Neurological:     General: No focal deficit present.     Mental Status: She is alert and oriented to person, place, and time.     (all labs ordered are listed, but only abnormal results are displayed) Labs Reviewed  CBC WITH DIFFERENTIAL/PLATELET  BASIC METABOLIC PANEL WITH GFR  D-DIMER, QUANTITATIVE  HCG, SERUM, QUALITATIVE    EKG: EKG Interpretation Date/Time:  Tuesday September 18 2023 23:43:22 EDT Ventricular Rate:  91 PR Interval:  173 QRS Duration:  90 QT Interval:  356 QTC Calculation: 438 R Axis:   79  Text Interpretation: Sinus rhythm Normal ECG Confirmed by Geroldine Berg ([id number]) on 09/18/2023 11:55:42 PM  Radiology: No results found.   Procedures   Medications Ordered in the ED - No data to display                                  Medical Decision Making Amount and/or Complexity of Data Reviewed Labs: ordered. Radiology: ordered.  Risk Prescription drug management.   Patient is a 28 year old female with history of factor V Leiden presenting with complaints of chest pain and concerns over a blood clot.  Patient arrives here with stable vital signs and is afebrile.  She is  not hypoxic or in any respiratory distress.  Physical examination basically unremarkable.  There is no leg edema and Toula' sign is absent bilaterally.  Laboratory studies obtained including CBC, metabolic panel, and D-dimer, all of which are unremarkable.  D-dimer is negative.  I did obtain a CTA of the chest given her history of factor V Leiden and has returned as unremarkable showing no evidence for DVT.  At this point, I feel as though patient can safely be discharged.  Cause of the chest discomfort unclear, but felt to be most likely musculoskeletal.  Patient to be discharged with NSAIDs, rest, and as needed return.     Final diagnoses:  None    ED Discharge Orders     None          Geroldine Berg, MD 09/19/23 [phone]

## 2023-09-19 NOTE — ED Notes (Signed)
 Patient transported to CT

## 2023-09-26 ENCOUNTER — Ambulatory Visit: Admitting: Nurse Practitioner

## 2023-10-01 ENCOUNTER — Other Ambulatory Visit: Payer: Self-pay

## 2023-10-01 DIAGNOSIS — D6851 Activated protein C resistance: Secondary | ICD-10-CM

## 2023-10-01 DIAGNOSIS — I872 Venous insufficiency (chronic) (peripheral): Secondary | ICD-10-CM

## 2023-10-15 NOTE — Progress Notes (Unsigned)
 [facility] [place]  ASSESSMENT / PLAN: Courtney Bush is a 28 y.o. female with chronic venous insufficiency of right > left lower extremity causing reticular veins about the popliteal fossa Venous duplex is significant for deep venous reflux. No saphenous vein remains that is refluxing. Recommend compression and elevation for symptomatic relief. The patient is anxious about CVI. She prefers yearly surveillance. I will see her again in a year with a venous duplex.   CHIEF COMPLAINT: swollen vein in right popliteal fossa  HISTORY OF PRESENT ILLNESS: Courtney Bush is a 28 y.o. female with history of chronic venous insufficiency managed by endovenous intervention with Dr. Gerlean 5 years ago.  Patient has mild swelling in the right leg which is somewhat bothersome to her.  She is also noticed a palpable knot behind her right knee.  She has noticed superficial veins which are prominent behind her right knee.  She presents to discuss options going forward.  Past Medical History:  Diagnosis Date   Allergy    factor fived deficiency   Asthma    Blood clot in vein    ? question of clot in vein per the patient, no documentation to support DVT in [facility]   Current smoker    Factor V deficiency (HCC)    Factor V Leiden carrier (HCC)    Migraine     Past Surgical History:  Procedure Laterality Date   ABDOMINAL HYSTERECTOMY  2020   total   BIOPSY  04/19/2023   Procedure: BIOPSY;  Surgeon: Cinderella Deatrice FALCON, MD;  Location: [facility];  Service: Endoscopy;;   CHOLECYSTECTOMY N/A 10/05/2017   Procedure: LAPAROSCOPIC CHOLECYSTECTOMY;  Surgeon: Kallie Manuelita BROCKS, MD;  Location: [facility];  Service: General;  Laterality: N/A;   ESOPHAGOGASTRODUODENOSCOPY (EGD) WITH PROPOFOL  N/A 04/19/2023   Procedure: ESOPHAGOGASTRODUODENOSCOPY (EGD) WITH PROPOFOL ;  Surgeon: Cinderella Deatrice FALCON, MD;  Location: [facility];  Service: Endoscopy;  Laterality: N/A;  9:15am;asa 1-2    TONSILLECTOMY     VEIN SURGERY  12/2014   WISDOM TOOTH EXTRACTION  2015    Family History  Problem Relation Age of Onset   Hypertension Father    Hyperlipidemia Father    High Cholesterol Father    Graves' disease Mother    Factor V Leiden deficiency Maternal Grandmother    Clotting disorder Maternal Grandmother    Seizures Neg Hx     Social History   Socioeconomic History   Marital status: Married    Spouse name: Not on file   Number of children: 0   Years of education: Not on file   Highest education level: Bachelor's degree (e.g., BA, AB, BS)  Occupational History   Not on file  Tobacco Use   Smoking status: Every Day    Current packs/day: 0.50    Average packs/day: 0.5 packs/day for 4.0 years (2.0 ttl pk-yrs)    Types: Cigarettes   Smokeless tobacco: Never  Vaping Use   Vaping status: Former  Substance and Sexual Activity   Alcohol use: Yes    Alcohol/week: 0.0 standard drinks of alcohol    Comment: occ   Drug use: No   Sexual activity: Yes    Birth control/protection: Condom, Surgical    Comment: total hysterectomy  Other Topics Concern   Not on file  Social History Narrative   Not on file   Social Drivers of Health   Financial Resource Strain: Low Risk  (08/29/2023)   Overall Physicist, medical Strain (  CARDIA)    Difficulty of Paying Living Expenses: Not hard at all  Food Insecurity: No Food Insecurity (08/29/2023)   Hunger Vital Sign    Worried About Running Out of Food in the Last Year: Never true    Ran Out of Food in the Last Year: Never true  Transportation Needs: No Transportation Needs (08/29/2023)   PRAPARE - Administrator, Civil Service (Medical): No    Lack of Transportation (Non-Medical): No  Physical Activity: Sufficiently Active (08/29/2023)   Exercise Vital Sign    Days of Exercise per Week: 5 days    Minutes of Exercise per Session: 30 min  Stress: No Stress Concern Present (08/29/2023)   [employer] of Occupational  Health - Occupational Stress Questionnaire    Feeling of Stress: Not at all  Social Connections: Moderately Integrated (08/29/2023)   Social Connection and Isolation Panel    Frequency of Communication with Friends and Family: More than three times a week    Frequency of Social Gatherings with Friends and Family: More than three times a week    Attends Religious Services: 1 to 4 times per year    Active Member of [employer] or Organizations: No    Attends Banker Meetings: Not on file    Marital Status: Married  Catering manager Violence: Not At Risk (08/14/2022)   Humiliation, Afraid, Rape, and Kick questionnaire    Fear of Current or Ex-Partner: No    Emotionally Abused: No    Physically Abused: No    Sexually Abused: No    Allergies  Allergen Reactions   Alpha-Gal Anaphylaxis    05/21/23 states she has been eating red meat and has not had any issues.    Other Hives and Rash    Tree nuts    Current Outpatient Medications  Medication Sig Dispense Refill   albuterol  (VENTOLIN  HFA) 108 (90 Base) MCG/ACT inhaler Inhale 1 puff into the lungs every 6 (six) hours as needed for wheezing or shortness of breath. 18 g 1   cetirizine  (ZYRTEC ) 10 MG tablet Take 1 tablet (10 mg total) by mouth daily. 90 tablet 1   EPINEPHrine  (EPIPEN ) 0.3 mg/0.3 mL SOAJ Inject 0.3 mLs (0.3 mg total) into the muscle once. 1 Device 1   fluticasone  (FLONASE ) 50 MCG/ACT nasal spray Place into both nostrils daily.     pantoprazole  (PROTONIX ) 40 MG tablet Take 1 tablet (40 mg total) by mouth daily. 30 tablet 3   No current facility-administered medications for this visit.    PHYSICAL EXAM There were no vitals filed for this visit.   Well-appearing young woman in no acute distress Regular rate and rhythm Unlabored breathing Thrombophlebitis behind the right knee in the popliteal fossa.  There are significant reticular veins in the space as well.  No other significant varicosities or reticular veins seen.   The right ankle is mildly edematous as compared to the left.  There are palpable dorsalis pedis pulses bilaterally   PERTINENT LABORATORY AND RADIOLOGIC DATA  Most recent CBC    Latest Ref Rng & Units 09/19/2023    1:19 AM 04/06/2023   10:43 AM 08/14/2022    8:51 AM  CBC  WBC 4.0 - 10.5 K/uL 7.0  4.7  4.9   Hemoglobin 12.0 - 15.0 g/dL 87.0  85.4  85.6   Hematocrit 36.0 - 46.0 % 38.1  43.5  43.6   Platelets 150 - 400 K/uL 240  270  256  Most recent CMP    Latest Ref Rng & Units 09/19/2023    1:19 AM 04/20/2023    8:44 AM 04/06/2023   10:43 AM  CMP  Glucose 70 - 99 mg/dL 893  85  97   BUN 6 - 20 mg/dL 11  10  12    Creatinine 0.44 - 1.00 mg/dL 9.30  9.26  9.10   Sodium 135 - 145 mmol/L 140  142  141   Potassium 3.5 - 5.1 mmol/L 3.3  4.8  4.7   Chloride 98 - 111 mmol/L 104  104  102   CO2 22 - 32 mmol/L 26  22  23    Calcium 8.9 - 10.3 mg/dL 9.2  89.8  9.9   Total Protein 6.0 - 8.5 g/dL  7.3  7.5   Total Bilirubin 0.0 - 1.2 mg/dL  <9.7  0.5   Alkaline Phos 44 - 121 IU/L  75  71   AST 0 - 40 IU/L  21  48   ALT 0 - 32 IU/L  18  61     Renal function CrCl cannot be calculated (Patient's most recent lab result is older than the maximum 21 days allowed.).  HB A1C (BAYER DCA - WAIVED) (%)  Date Value  08/14/2022 4.9    LDL Chol Calc (NIH)  Date Value Ref Range Status  02/21/2023 113 (H) 0 - 99 mg/dL Final    Debby SAILOR. Magda, MD FACS [facility] Phone Number: [phone] 10/15/2023 4:08 PM   Total time spent on preparing this encounter including chart review, data review, collecting history, examining the patient, coordinating care for this new patient, 45 minutes.  Portions of this report may have been transcribed using voice recognition software.  Every effort has been made to ensure accuracy; however, inadvertent computerized transcription errors may still be present.

## 2023-10-16 ENCOUNTER — Ambulatory Visit (HOSPITAL_COMMUNITY)
Admission: RE | Admit: 2023-10-16 | Discharge: 2023-10-16 | Disposition: A | Discharge status: Home / Self Care | Source: Ambulatory Visit | Attending: Vascular Surgery | Admitting: Vascular Surgery

## 2023-10-16 ENCOUNTER — Encounter: Payer: Self-pay | Admitting: Vascular Surgery

## 2023-10-16 ENCOUNTER — Ambulatory Visit: Attending: Vascular Surgery | Admitting: Vascular Surgery

## 2023-10-16 VITALS — BP 107/74 | HR 70 | Temp 97.8°F | Ht 67.0 in | Wt 151.0 lb

## 2023-10-16 DIAGNOSIS — D6851 Activated protein C resistance: Secondary | ICD-10-CM | POA: Insufficient documentation

## 2023-10-16 DIAGNOSIS — M7989 Other specified soft tissue disorders: Secondary | ICD-10-CM

## 2023-10-16 DIAGNOSIS — I872 Venous insufficiency (chronic) (peripheral): Secondary | ICD-10-CM

## 2023-12-26 ENCOUNTER — Encounter (INDEPENDENT_AMBULATORY_CARE_PROVIDER_SITE_OTHER): Payer: Self-pay | Admitting: Gastroenterology

## 2024-02-13 ENCOUNTER — Other Ambulatory Visit: Payer: Self-pay | Admitting: Medical Genetics

## 2024-02-20 ENCOUNTER — Ambulatory Visit: Admitting: Nurse Practitioner

## 2024-02-20 DIAGNOSIS — Z0001 Encounter for general adult medical examination with abnormal findings: Secondary | ICD-10-CM

## 2024-02-20 DIAGNOSIS — G43009 Migraine without aura, not intractable, without status migrainosus: Secondary | ICD-10-CM

## 2024-02-20 DIAGNOSIS — J302 Other seasonal allergic rhinitis: Secondary | ICD-10-CM

## 2024-02-25 DIAGNOSIS — Z1272 Encounter for screening for malignant neoplasm of vagina: Secondary | ICD-10-CM | POA: Diagnosis not present

## 2024-02-25 DIAGNOSIS — Z01419 Encounter for gynecological examination (general) (routine) without abnormal findings: Secondary | ICD-10-CM | POA: Diagnosis not present

## 2024-03-03 DIAGNOSIS — J111 Influenza due to unidentified influenza virus with other respiratory manifestations: Secondary | ICD-10-CM | POA: Diagnosis not present

## 2024-03-03 DIAGNOSIS — M791 Myalgia, unspecified site: Secondary | ICD-10-CM | POA: Diagnosis not present

## 2024-04-08 ENCOUNTER — Other Ambulatory Visit: Payer: Self-pay | Admitting: Medical Genetics

## 2024-04-08 DIAGNOSIS — Z006 Encounter for examination for normal comparison and control in clinical research program: Secondary | ICD-10-CM
